# Patient Record
Sex: Female | Born: 1950 | Race: White | Hispanic: No | Marital: Married | Smoking: Never smoker
Health system: Southern US, Community
[De-identification: ages and names within clinical notes are randomized; demographics above are authoritative.]

## PROBLEM LIST (undated history)

## (undated) DIAGNOSIS — M5137 Other intervertebral disc degeneration, lumbosacral region: Secondary | ICD-10-CM

## (undated) DIAGNOSIS — M8580 Other specified disorders of bone density and structure, unspecified site: Secondary | ICD-10-CM

## (undated) DIAGNOSIS — I10 Essential (primary) hypertension: Secondary | ICD-10-CM

## (undated) DIAGNOSIS — M51379 Other intervertebral disc degeneration, lumbosacral region without mention of lumbar back pain or lower extremity pain: Secondary | ICD-10-CM

## (undated) DIAGNOSIS — F32A Depression, unspecified: Secondary | ICD-10-CM

## (undated) DIAGNOSIS — F329 Major depressive disorder, single episode, unspecified: Secondary | ICD-10-CM

## (undated) DIAGNOSIS — I00 Rheumatic fever without heart involvement: Secondary | ICD-10-CM

## (undated) DIAGNOSIS — IMO0002 Reserved for concepts with insufficient information to code with codable children: Secondary | ICD-10-CM

## (undated) DIAGNOSIS — E079 Disorder of thyroid, unspecified: Secondary | ICD-10-CM

## (undated) DIAGNOSIS — E785 Hyperlipidemia, unspecified: Secondary | ICD-10-CM

## (undated) HISTORY — PX: NO PAST SURGERIES: SHX2092

## (undated) HISTORY — DX: Rheumatic fever without heart involvement: I00

## (undated) HISTORY — DX: Other specified disorders of bone density and structure, unspecified site: M85.80

## (undated) HISTORY — DX: Hyperlipidemia, unspecified: E78.5

## (undated) HISTORY — DX: Essential (primary) hypertension: I10

## (undated) HISTORY — DX: Major depressive disorder, single episode, unspecified: F32.9

## (undated) HISTORY — DX: Reserved for concepts with insufficient information to code with codable children: IMO0002

## (undated) HISTORY — DX: Other intervertebral disc degeneration, lumbosacral region without mention of lumbar back pain or lower extremity pain: M51.379

## (undated) HISTORY — DX: Depression, unspecified: F32.A

## (undated) HISTORY — DX: Other intervertebral disc degeneration, lumbosacral region: M51.37

## (undated) HISTORY — DX: Disorder of thyroid, unspecified: E07.9

---

## 1999-04-30 ENCOUNTER — Other Ambulatory Visit: Admission: RE | Admit: 1999-04-30 | Discharge: 1999-04-30 | Payer: Self-pay | Admitting: *Deleted

## 2001-04-04 ENCOUNTER — Other Ambulatory Visit: Admission: RE | Admit: 2001-04-04 | Discharge: 2001-04-04 | Payer: Self-pay | Admitting: *Deleted

## 2002-08-23 ENCOUNTER — Other Ambulatory Visit: Admission: RE | Admit: 2002-08-23 | Discharge: 2002-08-23 | Payer: Self-pay | Admitting: *Deleted

## 2003-02-04 ENCOUNTER — Ambulatory Visit (HOSPITAL_COMMUNITY): Admission: RE | Admit: 2003-02-04 | Discharge: 2003-02-04 | Payer: Self-pay | Admitting: Gastroenterology

## 2003-10-30 ENCOUNTER — Encounter (INDEPENDENT_AMBULATORY_CARE_PROVIDER_SITE_OTHER): Payer: Self-pay | Admitting: Specialist

## 2003-10-30 ENCOUNTER — Ambulatory Visit (HOSPITAL_COMMUNITY): Admission: RE | Admit: 2003-10-30 | Discharge: 2003-10-30 | Payer: Self-pay | Admitting: Endocrinology

## 2004-09-22 ENCOUNTER — Ambulatory Visit: Payer: Self-pay | Admitting: Internal Medicine

## 2004-12-23 ENCOUNTER — Ambulatory Visit: Payer: Self-pay | Admitting: Internal Medicine

## 2005-02-08 ENCOUNTER — Ambulatory Visit: Payer: Self-pay | Admitting: Internal Medicine

## 2005-09-22 ENCOUNTER — Ambulatory Visit: Payer: Self-pay | Admitting: Internal Medicine

## 2005-09-27 ENCOUNTER — Encounter: Admission: RE | Admit: 2005-09-27 | Discharge: 2005-09-27 | Payer: Self-pay | Admitting: Internal Medicine

## 2005-12-21 ENCOUNTER — Ambulatory Visit: Payer: Self-pay | Admitting: Internal Medicine

## 2006-03-29 ENCOUNTER — Ambulatory Visit: Payer: Self-pay | Admitting: Endocrinology

## 2006-03-29 LAB — CONVERTED CEMR LAB
Cholesterol: 204 mg/dL (ref 0–200)
Direct LDL: 129.4 mg/dL
HDL: 44.5 mg/dL (ref >39.0)
TSH: 3.11 microintl units/mL (ref 0.35–5.50)
Total CHOL/HDL Ratio: 4.6
Triglycerides: 188 mg/dL — ABNORMAL HIGH (ref 0–149)
VLDL: 38 mg/dL (ref 0–40)

## 2006-04-07 ENCOUNTER — Encounter: Admission: RE | Admit: 2006-04-07 | Discharge: 2006-04-07 | Payer: Self-pay | Admitting: Endocrinology

## 2006-07-21 ENCOUNTER — Ambulatory Visit: Payer: Self-pay | Admitting: Internal Medicine

## 2006-10-10 DIAGNOSIS — F3289 Other specified depressive episodes: Secondary | ICD-10-CM | POA: Insufficient documentation

## 2006-10-10 DIAGNOSIS — F329 Major depressive disorder, single episode, unspecified: Secondary | ICD-10-CM | POA: Insufficient documentation

## 2006-10-24 ENCOUNTER — Encounter: Payer: Self-pay | Admitting: Internal Medicine

## 2006-10-27 ENCOUNTER — Encounter: Payer: Self-pay | Admitting: *Deleted

## 2006-12-05 ENCOUNTER — Ambulatory Visit: Payer: Self-pay | Admitting: Internal Medicine

## 2006-12-05 DIAGNOSIS — E039 Hypothyroidism, unspecified: Secondary | ICD-10-CM | POA: Insufficient documentation

## 2006-12-05 DIAGNOSIS — E782 Mixed hyperlipidemia: Secondary | ICD-10-CM | POA: Insufficient documentation

## 2006-12-05 DIAGNOSIS — IMO0002 Reserved for concepts with insufficient information to code with codable children: Secondary | ICD-10-CM | POA: Insufficient documentation

## 2006-12-06 ENCOUNTER — Telehealth: Payer: Self-pay | Admitting: Internal Medicine

## 2006-12-07 ENCOUNTER — Ambulatory Visit: Payer: Self-pay | Admitting: Internal Medicine

## 2006-12-07 LAB — CONVERTED CEMR LAB: Vit D, 1,25-Dihydroxy: 36 (ref 30–89)

## 2006-12-12 LAB — CONVERTED CEMR LAB
ALT: 24 units/L (ref 0–35)
AST: 26 units/L (ref 0–37)
Albumin: 4 g/dL (ref 3.5–5.2)
Alkaline Phosphatase: 88 units/L (ref 39–117)
BUN: 18 mg/dL (ref 6–23)
Basophils Absolute: 0 10*3/uL (ref 0.0–0.1)
Basophils Relative: 0.3 % (ref 0.0–1.0)
Bilirubin, Direct: 0.1 mg/dL (ref 0.0–0.3)
CO2: 27 meq/L (ref 19–32)
Calcium: 9.7 mg/dL (ref 8.4–10.5)
Chloride: 106 meq/L (ref 96–112)
Cholesterol: 197 mg/dL (ref 0–200)
Creatinine, Ser: 0.7 mg/dL (ref 0.4–1.2)
Eosinophils Absolute: 0.2 10*3/uL (ref 0.0–0.6)
Eosinophils Relative: 2.7 % (ref 0.0–5.0)
GFR calc Af Amer: 111 mL/min
GFR calc non Af Amer: 92 mL/min
Glucose, Bld: 99 mg/dL (ref 70–99)
HCT: 37.8 % (ref 36.0–46.0)
HDL: 48.9 mg/dL (ref >39.0)
Hemoglobin: 13.1 g/dL (ref 12.0–15.0)
LDL Cholesterol: 128 mg/dL — ABNORMAL HIGH (ref 0–99)
Lymphocytes Relative: 31.6 % (ref 12.0–46.0)
MCHC: 34.7 g/dL (ref 30.0–36.0)
MCV: 88.5 fL (ref 78.0–100.0)
Monocytes Absolute: 0.4 10*3/uL (ref 0.2–0.7)
Monocytes Relative: 6.9 % (ref 3.0–11.0)
Neutro Abs: 3.6 10*3/uL (ref 1.4–7.7)
Neutrophils Relative %: 58.5 % (ref 43.0–77.0)
Platelets: 310 10*3/uL (ref 150–400)
Potassium: 4.4 meq/L (ref 3.5–5.1)
RBC: 4.28 M/uL (ref 3.87–5.11)
RDW: 11.8 % (ref 11.5–14.6)
Sodium: 141 meq/L (ref 135–145)
TSH: 3.26 microintl units/mL (ref 0.35–5.50)
Total Bilirubin: 0.6 mg/dL (ref 0.3–1.2)
Total CHOL/HDL Ratio: 4
Total Protein: 6.8 g/dL (ref 6.0–8.3)
Triglycerides: 101 mg/dL (ref 0–149)
VLDL: 20 mg/dL (ref 0–40)
WBC: 6.1 10*3/uL (ref 4.5–10.5)

## 2007-04-24 ENCOUNTER — Encounter: Payer: Self-pay | Admitting: Internal Medicine

## 2007-05-03 ENCOUNTER — Telehealth: Payer: Self-pay | Admitting: Internal Medicine

## 2007-06-28 ENCOUNTER — Ambulatory Visit: Payer: Self-pay | Admitting: Internal Medicine

## 2007-06-28 LAB — CONVERTED CEMR LAB
ALT: 25 units/L (ref 0–35)
AST: 25 units/L (ref 0–37)
Albumin: 4 g/dL (ref 3.5–5.2)
Alkaline Phosphatase: 91 units/L (ref 39–117)
Bilirubin, Direct: 0.2 mg/dL (ref 0.0–0.3)
Cholesterol: 205 mg/dL (ref 0–200)
Direct LDL: 143.1 mg/dL
HDL: 51.6 mg/dL (ref >39.0)
TSH: 1.67 microintl units/mL (ref 0.35–5.50)
Total Bilirubin: 0.7 mg/dL (ref 0.3–1.2)
Total CHOL/HDL Ratio: 4
Total Protein: 6.7 g/dL (ref 6.0–8.3)
Triglycerides: 114 mg/dL (ref 0–149)
VLDL: 23 mg/dL (ref 0–40)

## 2007-07-04 ENCOUNTER — Telehealth: Payer: Self-pay | Admitting: Internal Medicine

## 2008-03-05 ENCOUNTER — Ambulatory Visit: Payer: Self-pay | Admitting: Internal Medicine

## 2008-03-05 DIAGNOSIS — E669 Obesity, unspecified: Secondary | ICD-10-CM | POA: Insufficient documentation

## 2008-03-05 DIAGNOSIS — M6580 Other synovitis and tenosynovitis, unspecified site: Secondary | ICD-10-CM | POA: Insufficient documentation

## 2008-04-11 ENCOUNTER — Encounter: Payer: Self-pay | Admitting: Internal Medicine

## 2008-08-28 ENCOUNTER — Encounter: Payer: Self-pay | Admitting: *Deleted

## 2008-08-28 ENCOUNTER — Encounter: Payer: Self-pay | Admitting: Internal Medicine

## 2008-08-28 LAB — CONVERTED CEMR LAB
Creatinine, Ser: 0.84 mg/dL
HCT: 39.4 %
Hemoglobin: 13.6 g/dL
TSH: 2.52 microintl units/mL

## 2008-10-09 ENCOUNTER — Encounter: Payer: Self-pay | Admitting: *Deleted

## 2008-11-20 ENCOUNTER — Telehealth: Payer: Self-pay | Admitting: *Deleted

## 2008-11-21 ENCOUNTER — Ambulatory Visit: Payer: Self-pay | Admitting: Internal Medicine

## 2009-02-03 ENCOUNTER — Ambulatory Visit: Payer: Self-pay | Admitting: Internal Medicine

## 2009-02-03 DIAGNOSIS — M546 Pain in thoracic spine: Secondary | ICD-10-CM | POA: Insufficient documentation

## 2009-02-03 LAB — CONVERTED CEMR LAB
Bilirubin Urine: NEGATIVE
Glucose, Urine, Semiquant: NEGATIVE
Ketones, urine, test strip: NEGATIVE
Nitrite: NEGATIVE
Protein, U semiquant: NEGATIVE
Specific Gravity, Urine: <1.005
Urobilinogen, UA: 0.2
pH: 5

## 2009-02-04 ENCOUNTER — Telehealth: Payer: Self-pay | Admitting: *Deleted

## 2009-02-07 ENCOUNTER — Telehealth: Payer: Self-pay | Admitting: *Deleted

## 2009-02-17 ENCOUNTER — Ambulatory Visit: Payer: Self-pay | Admitting: Internal Medicine

## 2009-02-17 ENCOUNTER — Telehealth: Payer: Self-pay | Admitting: Internal Medicine

## 2009-02-17 LAB — CONVERTED CEMR LAB
Bilirubin Urine: NEGATIVE
Glucose, Urine, Semiquant: NEGATIVE
Ketones, urine, test strip: NEGATIVE
Nitrite: NEGATIVE
Protein, U semiquant: NEGATIVE
Specific Gravity, Urine: 1.025
Urobilinogen, UA: 2
pH: 5

## 2009-02-18 ENCOUNTER — Telehealth: Payer: Self-pay | Admitting: Internal Medicine

## 2009-02-19 ENCOUNTER — Ambulatory Visit: Payer: Self-pay | Admitting: Internal Medicine

## 2009-02-26 ENCOUNTER — Ambulatory Visit: Payer: Self-pay | Admitting: Internal Medicine

## 2009-02-26 ENCOUNTER — Telehealth: Payer: Self-pay | Admitting: Internal Medicine

## 2009-03-07 ENCOUNTER — Telehealth: Payer: Self-pay | Admitting: *Deleted

## 2009-03-07 LAB — CONVERTED CEMR LAB
Bilirubin Urine: NEGATIVE
Ketones, ur: NEGATIVE mg/dL
Nitrite: NEGATIVE
Specific Gravity, Urine: >=1.03 (ref 1.000–1.030)
Total Protein, Urine: NEGATIVE mg/dL
Urine Glucose: NEGATIVE mg/dL
Urobilinogen, UA: 0.2 (ref 0.0–1.0)
pH: 5 (ref 5.0–8.0)

## 2009-04-14 ENCOUNTER — Encounter: Payer: Self-pay | Admitting: Internal Medicine

## 2009-06-13 ENCOUNTER — Telehealth: Payer: Self-pay | Admitting: Internal Medicine

## 2009-07-14 ENCOUNTER — Ambulatory Visit: Payer: Self-pay | Admitting: Internal Medicine

## 2009-07-17 ENCOUNTER — Ambulatory Visit: Payer: Self-pay | Admitting: Internal Medicine

## 2009-07-17 LAB — CONVERTED CEMR LAB
OCCULT 1: NEGATIVE
OCCULT 2: NEGATIVE
OCCULT 3: NEGATIVE

## 2009-07-18 ENCOUNTER — Ambulatory Visit: Payer: Self-pay | Admitting: Internal Medicine

## 2009-07-18 DIAGNOSIS — R5383 Other fatigue: Secondary | ICD-10-CM

## 2009-07-18 DIAGNOSIS — R5381 Other malaise: Secondary | ICD-10-CM | POA: Insufficient documentation

## 2009-07-20 ENCOUNTER — Emergency Department (HOSPITAL_COMMUNITY): Admission: EM | Admit: 2009-07-20 | Discharge: 2009-07-20 | Payer: Self-pay | Admitting: Emergency Medicine

## 2009-07-22 ENCOUNTER — Telehealth (INDEPENDENT_AMBULATORY_CARE_PROVIDER_SITE_OTHER): Payer: Self-pay | Admitting: *Deleted

## 2009-07-24 ENCOUNTER — Telehealth: Payer: Self-pay | Admitting: Internal Medicine

## 2009-07-24 ENCOUNTER — Ambulatory Visit: Payer: Self-pay | Admitting: Internal Medicine

## 2009-07-24 LAB — CONVERTED CEMR LAB
Bilirubin Urine: NEGATIVE
Ketones, ur: NEGATIVE mg/dL
Nitrite: NEGATIVE
Specific Gravity, Urine: >=1.03 (ref 1.000–1.030)
Total Protein, Urine: NEGATIVE mg/dL
Urine Glucose: NEGATIVE mg/dL
Urobilinogen, UA: 0.2 (ref 0.0–1.0)
pH: 5 (ref 5.0–8.0)

## 2009-08-07 ENCOUNTER — Encounter: Payer: Self-pay | Admitting: Internal Medicine

## 2009-08-08 ENCOUNTER — Encounter: Payer: Self-pay | Admitting: *Deleted

## 2009-08-08 LAB — CONVERTED CEMR LAB: TSH: 1.666 microintl units/mL

## 2009-08-26 ENCOUNTER — Encounter: Payer: Self-pay | Admitting: *Deleted

## 2009-09-18 ENCOUNTER — Telehealth: Payer: Self-pay | Admitting: Internal Medicine

## 2010-01-13 ENCOUNTER — Ambulatory Visit
Admission: RE | Admit: 2010-01-13 | Discharge: 2010-01-13 | Payer: Self-pay | Source: Home / Self Care | Attending: Internal Medicine | Admitting: Internal Medicine

## 2010-01-13 DIAGNOSIS — J029 Acute pharyngitis, unspecified: Secondary | ICD-10-CM | POA: Insufficient documentation

## 2010-01-13 LAB — CONVERTED CEMR LAB: Rapid Strep: NEGATIVE

## 2010-01-14 ENCOUNTER — Encounter: Payer: Self-pay | Admitting: Internal Medicine

## 2010-01-16 ENCOUNTER — Telehealth: Payer: Self-pay | Admitting: Internal Medicine

## 2010-01-26 ENCOUNTER — Ambulatory Visit
Admission: RE | Admit: 2010-01-26 | Discharge: 2010-01-26 | Payer: Self-pay | Source: Home / Self Care | Attending: Internal Medicine | Admitting: Internal Medicine

## 2010-02-17 NOTE — Assessment & Plan Note (Signed)
Summary: stomach pain/change in bowells/cjr   Vital Signs:  Patient profile:   60 year old female Menstrual status:  postmenopausal Weight:      202 pounds Pulse rate:   72 / minute BP sitting:   134 / 80  (left arm) Cuff size:   regular  Vitals Entered By: Romualdo Bolk, CMA (AAMA) (July 14, 2009 2:19 PM) CC: Pt had diarrhea a 2  months ago that hasn't gone away, abd pain that comes and goes. No bloating, Pt does have some gas. Pt at first thought it was stress due to taking care of mom. Pt hasn't traveled out side the country. No fever with this. No blood in stool. Pain is in the middle., CHF Management   History of Present Illness: Taylor Lee comes in today  for above problem  , acute visit . She says that her bowel habits were regular until 2 months  or so ago when had the onset of fever and diarrhea like a bowel infection that improved but then never but never resolved.  She has been busy taking care of her mom after a hip fracture so didnt attend to this until now.  She co fo loose stools and lower abd cramps  off and on . No fever weight loss or UTI signs .  Worried about colon ca . last colon was about 10 years ago.   She   has no nocturnal signs and no vomiting. and no   uti signs now.   No dietary change   or rx .   Was on ciro in January for poss UTI LAst colonoscopy.   ?   10 years ago   was normal .  ? Dr Loreta Ave   Preventive Screening-Counseling & Management  Alcohol-Tobacco     Alcohol drinks/day: <1     Alcohol type: wine     Smoking Status: never  Caffeine-Diet-Exercise     Caffeine use/day: 1-2     Does Patient Exercise: yes  Current Medications (verified): 1)  Levothroid 75 Mcg  Tabs (Levothyroxine Sodium) .Marland Kitchen.. 1 By Mouth Once Daily 2)  Vicodin 5-500 Mg Tabs (Hydrocodone-Acetaminophen) .Marland Kitchen.. 1 By Mouth Every 4-6 Hours As Needed For Pain 3)  Naproxen 500 Mg  Tabs (Naproxen) .Marland Kitchen.. 1 By Mouth Two Times A Day For Pain 4)  Cyclobenzaprine Hcl 10 Mg Tabs  (Cyclobenzaprine Hcl) .Marland Kitchen.. 1 By Mouth Three Times A Day As Needed Muslce Spasm 5)  Lorazepam 0.5 Mg Tabs (Lorazepam) .Marland Kitchen.. 1-2 By Mouth Three Times A Day As Needed Muscle Spasm of Anxiety  Allergies (verified): 1)  Darvocet-N 100  Past History:  Past medical, surgical, family and social histories (including risk factors) reviewed, and no changes noted (except as noted below).  Past Medical History: Reviewed history from 03/05/2008 and no changes required. Depression Rheumatic Fever Thyroid Disease hnp l5-S1  Consults Dr. Allegra Grana CV consult  4/09   Past Surgical History: Reviewed history from 10/10/2006 and no changes required. Denies surgical history  Past History:  Care Management: Neurosurgery: Venetia Maxon Chiro: [facility]  Family History: Reviewed history from 03/05/2008 and no changes required. Family History of Sudden Death Family History of Cardiovascular disorder  Mom  age 75  hip fracture      Social History: Reviewed history from 03/05/2008 and no changes required. Married Never Smoked Alcohol use-yes Drug use-no Regular exercise-yes dog not sick   mom had hip fracture  was caretaker but doing well.     Review of  Systems       back   pain better after exercise and chiro not on meds for this now,  alos had gyne eval for bloating and neg eval for gyne pathology and had neg labs last august.   Physical Exam  General:  Well-developed,well-nourished,in no acute distress; alert,appropriate and cooperative throughout examination Head:  normocephalic and atraumatic.   Eyes:  vision grossly intact, pupils equal, pupils round, and pupils reactive to light.   Neck:  No deformities, masses, or tenderness noted. Lungs:  Normal respiratory effort, chest expands symmetrically. Lungs are clear to auscultation, no crackles or wheezes. Heart:  Normal rate and regular rhythm. S1 and S2 normal without gallop, murmur, click, rub or other extra sounds. Abdomen:   soft, normal bowel sounds, no distention, no hepatomegaly, and no splenomegaly.  mildly tender suprapubic area  and pelvic area  Pulses:  pulses intact without delay   Extremities:  no clubbing cyanosis or edema  Neurologic:  non focal Skin:  turgor normal.   Cervical Nodes:  No lymphadenopathy noted Psych:  Oriented X3, normally interactive, good eye contact, not depressed appearing, and slightly anxious.     Impression & Recommendations:  Problem # 1:  DIARRHEA (ICD-787.91) Assessment New onset after what sounds like an acute GE .    will rx empirically for  bowel overgrowth and c diff  pending tests,  Orders: T-Stool Giardia / Crypto- EIA ([address]) T-Culture, C-Diff Toxin A/B (60454-09811) T-Culture, Stool ([id number]) T-Fecal Lactoferrin (91478) Gastroenterology Referral (GI)  Problem # 2:  ABDOMINAL CRAMPS (ICD-789.00) Assessment: New  see above  Orders: Gastroenterology Referral (GI)  Problem # 3:  HYPOTHYROIDISM (ICD-244.9)  Her updated medication list for this problem includes:    Levothroid 75 Mcg Tabs (Levothyroxine sodium) .Marland Kitchen... 1 by mouth once daily  Labs Reviewed: TSH: 2.52 (08/28/2008)    Chol: 205 (06/28/2007)   HDL: 51.6 (06/28/2007)   LDL: DEL (06/28/2007)   TG: 114 (06/28/2007)  Problem # 4:  HYPERLIPIDEMIA (ICD-272.2)  Complete Medication List: 1)  Levothroid 75 Mcg Tabs (Levothyroxine sodium) .Marland Kitchen.. 1 by mouth once daily 2)  Vicodin 5-500 Mg Tabs (Hydrocodone-acetaminophen) .Marland Kitchen.. 1 by mouth every 4-6 hours as needed for pain 3)  Naproxen 500 Mg Tabs (Naproxen) .Marland Kitchen.. 1 by mouth two times a day for pain 4)  Cyclobenzaprine Hcl 10 Mg Tabs (Cyclobenzaprine hcl) .Marland Kitchen.. 1 by mouth three times a day as needed muslce spasm 5)  Lorazepam 0.5 Mg Tabs (Lorazepam) .Marland Kitchen.. 1-2 by mouth three times a day as needed muscle spasm of anxiety 6)  Metronidazole 500 Mg Tabs (Metronidazole) .Marland Kitchen.. 1 by mouth three times a day  CHF Assessment/Plan:      The patient's current  weight is 202 pounds.  Her previous weight was 199 pounds.    Patient Instructions: 1)  collect the stool test to the lab. 2)  then can begin the metronidazole medication. 3)  will do a  GI referral     for the above     and plan for colonosocopy . 4)  Will plan a  checkup   in the fall  Prescriptions: METRONIDAZOLE 500 MG TABS (METRONIDAZOLE) 1 by mouth three times a day  #21 x 0   Entered and Authorized by:   Madelin Headings MD   Signed by:   Madelin Headings MD on 07/14/2009   Method used:   Electronically to        [facility] #[id number]* (retail)  [address]       [place], [place]  [address]       Ph: [phone]       Fax: [phone]   RxID:   [id number]  cpx in the fall  sept  with labs  or as needed

## 2010-02-17 NOTE — Progress Notes (Signed)
  Phone Note From Pharmacy   Summary of Call: Vicodin tablets 5 - 500mg  from [facility] Initial call taken by: Kathrynn Speed CMA,  Jun 13, 2009 1:24 PM  Follow-up for Phone Call        ok x 1  Follow-up by: Madelin Headings MD,  Jun 13, 2009 1:28 PM    Prescriptions: VICODIN 5-500 MG TABS (HYDROCODONE-ACETAMINOPHEN) 1 by mouth every 4-6 hours as needed for pain  #40 x 0   Entered by:   Lynann Beaver CMA   Authorized by:   Madelin Headings MD   Signed by:   Lynann Beaver CMA on 06/13/2009   Method used:   Telephoned to ...       [facility] #[id number]* (retail)       [address]       [place], [place]  [address]       Ph: [phone]       Fax: [phone]   RxID:   [id number]

## 2010-02-17 NOTE — Progress Notes (Signed)
Summary: INFO ONLY  Phone Note Call from Patient   Summary of Call: Pt called to adv that she had to go to the ED this past weekend and was evaluated.... Pt adv they dx her with a UTI and prescribed abx that have started helping her already.... Pt adv she had a CT and they told her that everything checked out ok and that the only thing she had wrong was a UTI.Marland KitchenMarland KitchenMarland KitchenPt adv that she feels that she doesn't need any further testing because of recent sxs...Marland KitchenMarland KitchenMarland Kitchen Pt can be reached at [phone] with any questions or concerns.   Initial call taken by: Debbra Riding,  July 22, 2009 10:00 AM  Follow-up for Phone Call        she still needs to be evaluated by GI  all of her signs do not fit  uti as the only diagnosis . also  the urine culture  from the ED  showec no germ growing  like the one we got in January ( itsposs from  beginning antibioit c.?)  We  see her in follow up next week to repeat her U/A   to make sure it looks clear  Follow-up by: Madelin Headings MD,  July 22, 2009 1:16 PM  Additional Follow-up for Phone Call Additional follow up Details #1::        Informed pt Additional Follow-up by: Josph Macho RMA,  July 23, 2009 9:40 AM

## 2010-02-17 NOTE — Progress Notes (Signed)
Summary: form  Phone Note Call from Patient Call back at [facility] Phone [phone]   Call For: panosh Summary of Call: Had to cancel a trip because of my back.  I am bringing a form by from travel agency for Dr. Demetrius Charity to fill out.  Please call me if any questions or to pick form up.   Initial call taken by: Rudy Jew, RN,  February 18, 2009 12:00 PM  Follow-up for Phone Call        form was completed . Follow-up by: Madelin Headings MD,  February 23, 2009 5:45 PM

## 2010-02-17 NOTE — Miscellaneous (Signed)
Summary: labs done by Dr. Loreta Ave   Clinical Lists Changes  Observations: Added new observation of TSH: 1.666 microintl units/mL (08/08/2009 9:54)      -  Date:  08/08/2009    TSH 1.666

## 2010-02-17 NOTE — Progress Notes (Signed)
Summary: refill on antibiotic  Phone Note Call from Patient Call back at Home Phone [phone]   Caller: Patient Summary of Call: Pt is going be out of the country by the time she finishes the anitbiotic. So she will come back in once she gets back 2 weeks later. She is wanting to know if we could refill her medication so she would have it to take with her incase she needs it while she is over seas? Initial call taken by: Romualdo Bolk, CMA (AAMA),  February 07, 2009 10:53 AM  Follow-up for Phone Call        we can refill it  .   also  .  rec get repeat UA   with microscopic at [facility]  to ensure that things are better  Follow-up by: Madelin Headings MD,  February 07, 2009 12:23 PM  Additional Follow-up for Phone Call Additional follow up Details #1::        Left message on machine that rx has been called in and that she needs to call us back once she get back in town to get a repeat ua. Additional Follow-up by: Romualdo Bolk, CMA (AAMA),  February 07, 2009 1:05 PM    Prescriptions: CIPROFLOXACIN HCL 500 MG TABS (CIPROFLOXACIN HCL) 1 by mouth two times a day  #14 x 0   Entered by:   Romualdo Bolk, CMA (AAMA)   Authorized by:   Madelin Headings MD   Signed by:   Romualdo Bolk, CMA (AAMA) on 02/07/2009   Method used:   Electronically to        [facility] #[id number]* (retail)       [address]       [place], [place]  [address]       Ph: [phone]       Fax: [phone]   RxID:   [id number]

## 2010-02-17 NOTE — Progress Notes (Signed)
Summary: needs a note for trip  Phone Note Call from Patient Call back at Home Phone [phone]   Caller: Patient Summary of Call: Pt called waiting a note because she is unable to make her trip. She can't sit. Initial call taken by: Romualdo Bolk, CMA (AAMA),  February 17, 2009 4:44 PM

## 2010-02-17 NOTE — Letter (Signed)
Summary: [facility]  [facility]   Imported By: Maryln Gottron 08/14/2009 13:27:52  _____________________________________________________________________  External Attachment:    Type:   Image     Comment:   External Document

## 2010-02-17 NOTE — Progress Notes (Signed)
Summary: beets  Phone Note Call from Patient Call back at [facility] Phone [phone] Call back at [phone] c   Summary of Call: Ate beets last pm & they'll probably turn my urine pink.  Do I need to reschedule to the urine at [facility]? Initial call taken by: Rudy Jew, RN,  February 26, 2009 10:17 AM  Follow-up for Phone Call        Left message for pt that she could reschedule but since she already went not to worry about it and we'll see what the ua shows. Follow-up by: Romualdo Bolk, CMA (AAMA),  February 26, 2009 1:13 PM

## 2010-02-17 NOTE — Progress Notes (Signed)
Summary: abdominal pain continued.  Phone Note Call from Patient   Caller: Patient Call For: Madelin Headings MD Summary of Call: Pt wants to see Dr Fabian Sharp today as her abdominal pain has become worse.  Has diarrrhea, no fever, no vomiting, pain is lower abdomen with NO UTI symptoms.  Still taking Cipro, and Flagyl.  Taking pain pills occ.  [phone] Initial call taken by: Lynann Beaver CMA,  July 24, 2009 8:19 AM  Follow-up for Phone Call        Per Dr. Fabian Sharp .........change Cipro to Macrobid 100 mg. two times a day x 7 days UA with micro at [facility] up GI appt.  Pt notified.    New/Updated Medications: MACROBID 100 MG CAPS (NITROFURANTOIN MONOHYD MACRO) one by mouth bid Prescriptions: MACROBID 100 MG CAPS (NITROFURANTOIN MONOHYD MACRO) one by mouth bid  #14 x 0   Entered by:   Lynann Beaver CMA   Authorized by:   Madelin Headings MD   Signed by:   Lynann Beaver CMA on 07/24/2009   Method used:   Electronically to        [facility] #[id number]* (retail)       [address]       [place], [place]  [address]       Ph: [phone]       Fax: [phone]   RxID:   [id number]

## 2010-02-17 NOTE — Letter (Signed)
Summary: Attending Physician's Statement  Attending Physician's Statement   Imported By: Maryln Gottron 02/28/2009 10:49:38  _____________________________________________________________________  External Attachment:    Type:   Image     Comment:   External Document

## 2010-02-17 NOTE — Progress Notes (Signed)
Summary: results of xrays  Phone Note Call from Patient   Caller: Patient Call For: Madelin Headings MD Summary of Call: Pt would like chest xray and thoracic spine results, please. [phone] Initial call taken by: Lynann Beaver CMA,  February 04, 2009 3:06 PM  Follow-up for Phone Call        chest x ray show no acute disease or pneumonia.   spine x ray shows some mild degenerative changes  .  POss to cause pain but changes are mild.  on the x ray. Follow-up by: Madelin Headings MD,  February 04, 2009 5:28 PM  Additional Follow-up for Phone Call Additional follow up Details #1::        Pt aware of results. Additional Follow-up by: Romualdo Bolk, CMA Duncan Dull),  February 04, 2009 5:36 PM

## 2010-02-17 NOTE — Assessment & Plan Note (Signed)
Summary: fup bladder infection//ccm   Vital Signs:  Patient profile:   60 year old female Menstrual status:  postmenopausal Weight:      199 pounds Pulse rate:   66 / minute BP sitting:   130 / 80  (right arm) Cuff size:   regular  Vitals Entered By: Romualdo Bolk, CMA (AAMA) (February 17, 2009 8:46 AM) CC: Follow-up visit on bladder infection   History of Present Illness: Taylor Lee comesin     today for    follow up of fever, rti ,abnormal UA ,and back pain  The thorax pain is better  but still has the Lumbar pain  with shooting pains  down buttocks ( her HNP ) pain  although Back better  but fearful that it will reexacerbated  .   Hurts with sitting.    Walking ok now. 50 % better.   worried it will get worse on plight where she will need to sit for 12 hours. there.  No bowel or bladder dysfunction. Vicodin as needed.  Last dose  days ago.   flexeril as needed.  No dysuria  or other uti signs at present.   Preventive Screening-Counseling & Management  Alcohol-Tobacco     Alcohol drinks/day: <1     Alcohol type: wine     Smoking Status: never  Caffeine-Diet-Exercise     Caffeine use/day: 1-2     Does Patient Exercise: yes  Current Medications (verified): 1)  Levothroid 75 Mcg  Tabs (Levothyroxine Sodium) .Marland Kitchen.. 1 By Mouth Once Daily 2)  Vicodin 5-500 Mg Tabs (Hydrocodone-Acetaminophen) .Marland Kitchen.. 1 By Mouth Every 4-6 Hours As Needed For Pain 3)  Naproxen 500 Mg  Tabs (Naproxen) .Marland Kitchen.. 1 By Mouth Two Times A Day For Pain 4)  Cyclobenzaprine Hcl 10 Mg Tabs (Cyclobenzaprine Hcl) .Marland Kitchen.. 1 By Mouth Three Times A Day As Needed Muslce Spasm  Allergies (verified): 1)  Darvocet-N 100  Past History:  Past medical, surgical, family and social histories (including risk factors) reviewed, and no changes noted (except as noted below).  Past Medical History: Reviewed history from 03/05/2008 and no changes required. Depression Rheumatic Fever Thyroid Disease hnp  l5-S1  Consults Dr. Allegra Grana CV consult  4/09   Past Surgical History: Reviewed history from 10/10/2006 and no changes required. Denies surgical history  Family History: Reviewed history from 03/05/2008 and no changes required. Family History of Sudden Death Family History of Cardiovascular disorder      Social History: Reviewed history from 03/05/2008 and no changes required. Married Never Smoked Alcohol use-yes Drug use-no Regular exercise-yes    Review of Systems  The patient denies anorexia, fever, weight loss, weight gain, vision loss, hoarseness, prolonged cough, hemoptysis, abdominal pain, melena, hematochezia, severe indigestion/heartburn, hematuria, suspicious skin lesions, transient blindness, unusual weight change, abnormal bleeding, enlarged lymph nodes, and angioedema.         no numbness or weakness  Physical Exam  General:  Well-developed,well-nourished,in no acute distress; alert,appropriate and cooperative throughout examination Head:  normocephalic and atraumatic.   Neck:  No deformities, masses, or tenderness noted. Abdomen:  no cva pain Msk:  tender  ls area   but gait ok today  Pulses:  pulses intact without delay   Extremities:  no clubbing cyanosis or edema  Neurologic:  alert & oriented X3 and gait normal.   Skin:  turgor normal and color normal.   Psych:  Oriented X3, normally interactive, good eye contact, not depressed appearing, and slightly anxious.  Impression & Recommendations:  Problem # 1:  BACK PAIN WITH RADICULOPATHY (ICD-729.2) improved but unusre if ok to travel for 12 hours sitting on an international flight  .   disc options with pt.   Problem # 2:  UPPER RESPIRATORY INFECTION, VIRAL (ICD-465.9) Assessment: Improved  Her updated medication list for this problem includes:    Naproxen 500 Mg Tabs (Naproxen) .Marland Kitchen... 1 by mouth two times a day for pain  Problem # 3:  URINALYSIS, ABNORMAL (ICD-791.9)  ? if   significant  take cipr o to take with her   Orders: UA Dipstick w/o Micro (automated)  (81003) T-Culture, Urine ([id number])  Problem # 4:  BACK PAIN, THORACIC REGION (ICD-724.1) Assessment: Improved  Her updated medication list for this problem includes:    Vicodin 5-500 Mg Tabs (Hydrocodone-acetaminophen) .Marland Kitchen... 1 by mouth every 4-6 hours as needed for pain    Naproxen 500 Mg Tabs (Naproxen) .Marland Kitchen... 1 by mouth two times a day for pain    Cyclobenzaprine Hcl 10 Mg Tabs (Cyclobenzaprine hcl) .Marland Kitchen... 1 by mouth three times a day as needed muslce spasm  Complete Medication List: 1)  Levothroid 75 Mcg Tabs (Levothyroxine sodium) .Marland Kitchen.. 1 by mouth once daily 2)  Vicodin 5-500 Mg Tabs (Hydrocodone-acetaminophen) .Marland Kitchen.. 1 by mouth every 4-6 hours as needed for pain 3)  Naproxen 500 Mg Tabs (Naproxen) .Marland Kitchen.. 1 by mouth two times a day for pain 4)  Cyclobenzaprine Hcl 10 Mg Tabs (Cyclobenzaprine hcl) .Marland Kitchen.. 1 by mouth three times a day as needed muslce spasm 5)  Lorazepam 0.5 Mg Tabs (Lorazepam) .Marland Kitchen.. 1-2 by mouth three times a day as needed muscle spasm of anxiety  Patient Instructions: 1)  can take lorezepam  before flight  for anxiety and muscle spasm( (not at the same time as flexeril)  2)  Frequent  standing and walking if able .  3)  Call  if pain too much  to travel the 12 hours . Prescriptions: LORAZEPAM 0.5 MG TABS (LORAZEPAM) 1-2 by mouth three times a day as needed muscle spasm of anxiety  #20 x 0   Entered and Authorized by:   Madelin Headings MD   Signed by:   Madelin Headings MD on 02/17/2009   Method used:   Print then Give to Patient   RxID:   [id number]   Laboratory Results   Urine Tests  Date/Time Received: February 17, 2009 8:58 AM   Routine Urinalysis   Glucose: negative   (Normal Range: Negative) Bilirubin: negative   (Normal Range: Negative) Ketone: negative   (Normal Range: Negative) Spec. Gravity: 1.025   (Normal Range: 1.003-1.035) Blood: small   (Normal Range:  Negative) pH: 5.0   (Normal Range: 5.0-8.0) Protein: negative   (Normal Range: Negative) Urobilinogen: 2.0   (Normal Range: 0-1) Nitrite: negative   (Normal Range: Negative) Leukocyte Esterace: small   (Normal Range: Negative)    Comments: Romualdo Bolk, CMA (AAMA)  February 17, 2009 9:02 AM

## 2010-02-17 NOTE — Assessment & Plan Note (Signed)
Summary: abdomen pain better but continues/dm   Vital Signs:  Patient profile:   60 year old female Menstrual status:  postmenopausal Weight:      205 pounds Temp:     98.5 degrees F oral Pulse rate:   88 / minute BP sitting:   150 / 90  (left arm) Cuff size:   regular  Vitals Entered By: Romualdo Bolk, CMA (AAMA) (July 18, 2009 3:54 PM) CC: Pt is still having abd. pain and now has weakness with it. Pt found a tick embedded on her behind rt ear on June 10th.   History of Present Illness: Taylor Lee comes  in today  for sda because  her abd signs  got worse today and felt weak and wanted to be seen before the holiday weekend.  Is on 2-3 of flagyl and felt better yesterday    and had less cramps.   Today now worse again and  and feels weak.   No fever.  no rashes  . tick bite month ago .  Now is very worried  she could have cancer or something else bad. I consult is pending.  HAsnt taken anything for pain .? can take hydrocodone?  Preventive Screening-Counseling & Management  Alcohol-Tobacco     Alcohol drinks/day: <1     Alcohol type: wine     Smoking Status: never  Caffeine-Diet-Exercise     Caffeine use/day: 1-2     Does Patient Exercise: yes  Current Medications (verified): 1)  Levothroid 75 Mcg  Tabs (Levothyroxine Sodium) .Marland Kitchen.. 1 By Mouth Once Daily 2)  Vicodin 5-500 Mg Tabs (Hydrocodone-Acetaminophen) .Marland Kitchen.. 1 By Mouth Every 4-6 Hours As Needed For Pain 3)  Naproxen 500 Mg  Tabs (Naproxen) .Marland Kitchen.. 1 By Mouth Two Times A Day For Pain 4)  Cyclobenzaprine Hcl 10 Mg Tabs (Cyclobenzaprine Hcl) .Marland Kitchen.. 1 By Mouth Three Times A Day As Needed Muslce Spasm 5)  Lorazepam 0.5 Mg Tabs (Lorazepam) .Marland Kitchen.. 1-2 By Mouth Three Times A Day As Needed Muscle Spasm of Anxiety 6)  Metronidazole 500 Mg Tabs (Metronidazole) .Marland Kitchen.. 1 By Mouth Three Times A Day  Allergies (verified): 1)  Darvocet-N 100  Past History:  Past medical, surgical, family and social histories (including risk  factors) reviewed, and no changes noted (except as noted below).  Past Medical History: Reviewed history from 03/05/2008 and no changes required. Depression Rheumatic Fever Thyroid Disease hnp l5-S1  Consults Dr. Allegra Grana CV consult  4/09   Past Surgical History: Reviewed history from 10/10/2006 and no changes required. Denies surgical history  Past History:  Care Management: Neurosurgery: Venetia Maxon Chiro: Fonke  Family History: Reviewed history from 07/14/2009 and no changes required. Family History of Sudden Death Family History of Cardiovascular disorder  Mom  age 31  hip fracture      Social History: Reviewed history from 07/14/2009 and no changes required. Married Never Smoked Alcohol use-yes Drug use-no Regular exercise-yes dog not sick   mom had hip fracture  was caretaker but doing well.     Review of Systems       The patient complains of anorexia.  The patient denies fever, weight loss, weight gain, vision loss, decreased hearing, melena, hematochezia, severe indigestion/heartburn, hematuria, genital sores, difficulty walking, unusual weight change, abnormal bleeding, enlarged lymph nodes, and angioedema.    Physical Exam  General:  alert, well-developed, and well-nourished.  worried tearful but in nad  Head:  normocephalic and atraumatic.   Eyes:  vision grossly  intact and pupils equal.   Neck:  No deformities, masses, or tenderness noted. Lungs:  Normal respiratory effort, chest expands symmetrically. Lungs are clear to auscultation, no crackles or wheezes. Heart:  Normal rate and regular rhythm. S1 and S2 normal without gallop, murmur, click, rub or other extra sounds. Abdomen:  soft, normal bowel sounds, no distention, no hepatomegaly, and no splenomegaly.  mildly tender suprapubic area  and pelvic area  / left no mass and no guard or rebound and gait is nl  neg psoas    sign  Pulses:  nl cap refill  Extremities:  no clubbing cyanosis or  edema  Neurologic:  non focal  Skin:  turgor normal, color normal, no ecchymoses, and no petechiae.   Cervical Nodes:  No lymphadenopathy noted Psych:  Oriented X3 and slightly anxious.     Impression & Recommendations:  Problem # 1:  ABDOMINAL CRAMPS (ICD-789.00) had been better for once day and now worse  c diff neg and  neg for blood and wbc.    had omrpovement and then worse again   will treat for diverticulitis   to the ED  if getting worse  in the meantime or call the oncal service   Problem # 2:  MALAISE AND FATIGUE (ICD-780.79) x 1 day  dont really think its the med       patient is very anxious   about this     Problem # 3:  DIARRHEA (ICD-787.91) Assessment: Improved after 2 days of med   ?   Complete Medication List: 1)  Levothroid 75 Mcg Tabs (Levothyroxine sodium) .Marland Kitchen.. 1 by mouth once daily 2)  Vicodin 5-500 Mg Tabs (Hydrocodone-acetaminophen) .Marland Kitchen.. 1 by mouth every 4-6 hours as needed for pain 3)  Naproxen 500 Mg Tabs (Naproxen) .Marland Kitchen.. 1 by mouth two times a day for pain 4)  Cyclobenzaprine Hcl 10 Mg Tabs (Cyclobenzaprine hcl) .Marland Kitchen.. 1 by mouth three times a day as needed muslce spasm 5)  Lorazepam 0.5 Mg Tabs (Lorazepam) .Marland Kitchen.. 1-2 by mouth three times a day as needed muscle spasm of anxiety 6)  Metronidazole 500 Mg Tabs (Metronidazole) .Marland Kitchen.. 1 by mouth three times a day 7)  Ciprofloxacin Hcl 500 Mg Tabs (Ciprofloxacin hcl) .Marland Kitchen.. 1 by mouth two times a day  Patient Instructions: 1)  we are treating you for possible diverticulitis. 2)  with 2 antibiotics 3)  stay on these.  over the  weekend   and call on tuesday  for my nurse or triage. about how you are doing .  So we can plan follow up .  Prescriptions: CIPROFLOXACIN HCL 500 MG TABS (CIPROFLOXACIN HCL) 1 by mouth two times a day  #20 x 0   Entered and Authorized by:   Madelin Headings MD   Signed by:   Madelin Headings MD on 07/18/2009   Method used:   Electronically to        [facility] #[id number]* (retail)        [address]       [place], [place]  [address]       Ph: [phone]       Fax: [phone]   RxID:   [id number]

## 2010-02-17 NOTE — Progress Notes (Signed)
Summary: Pt req to get shingles vaccine if available  Phone Note Call from Patient Call back at Home Phone [phone]   Caller: Patient Summary of Call: Pt is interested in getting shingles vaccine and is wondering if it is available. Pls advise.  Initial call taken by: Lucy Antigua,  September 18, 2009 11:56 AM  Follow-up for Phone Call        Okay to schedule appt. Tell pt to make sure her ins covers vaccine. Follow-up by: Romualdo Bolk, CMA (AAMA),  September 19, 2009 8:09 AM  Additional Follow-up for Phone Call Additional follow up Details #1::        I lft vm for pt to cb.  Additional Follow-up by: Lucy Antigua,  September 19, 2009 9:11 AM    Additional Follow-up for Phone Call Additional follow up Details #2::    I called and lft another vm for pt to cb. Waiting on call back. Follow-up by: Lucy Antigua,  September 23, 2009 11:42 AM  Additional Follow-up for Phone Call Additional follow up Details #3:: Details for Additional Follow-up Action Taken: I called and notified pt that it is ok to sch appt for shingles vaccine, but pt needs to be sure to contact her insurance company to  make sure that they will cover the vaccine, as noted above.  Additional Follow-up by: Lucy Antigua,  September 25, 2009 10:11 AM

## 2010-02-17 NOTE — Progress Notes (Signed)
Summary: still having lower back pain  Phone Note Call from Patient Call back at Home Phone [phone]   Caller: Patient Summary of Call: Pt states that she is still having lower back pain and it hurts to sit. What is the next step? Initial call taken by: Romualdo Bolk, CMA Duncan Dull),  March 07, 2009 1:39 PM  Follow-up for Phone Call        rec see ortho pedics consult Follow-up by: Madelin Headings MD,  March 14, 2009 5:11 PM  Additional Follow-up for Phone Call Additional follow up Details #1::        Pt aware of this and will call us back about this. She states that the back pain has gotten better.  Additional Follow-up by: Romualdo Bolk, CMA Duncan Dull),  March 14, 2009 5:16 PM

## 2010-02-17 NOTE — Assessment & Plan Note (Signed)
Summary: fever/chills/back pain/njr   Vital Signs:  Patient profile:   61 year old female Menstrual status:  postmenopausal Height:      65 inches Weight:      200 pounds BMI:     33.40 Temp:     98.0 degrees F oral Pulse rate:   72 / minute BP sitting:   150 / 80  (right arm) Cuff size:   regular  Vitals Entered By: Romualdo Bolk, CMA (AAMA) (February 03, 2009 10:03 AM) CC: Fever over 100 (in the middle of the night), chills, middle back pain that has been going on before christmas. Pt is leaving 2/1 for a trip to the [facility].  LMP - Character: age [age] Menarche (age onset years): 33    Menstrual Status postmenopausal   History of Present Illness: Taylor Lee comes in today for   for Acute visit for above.  She has a number of issues.   Fever since 24-48 hours ago  with chills  ..then developed  sore glands and  cough  .   trying   otcs no face pain .   Back since  December  and has taken 10 vidodin for pain.  Seeing chiropractor therapist  thinking that her problem is stress. SHe has had LBP but this is in thorax area.  Denies dysuria  some frequency . NO vag problem.   Supposed to travel soon and worried that her back will keep her from travel as it feels badlu. She has seen NS for lumbar HNP  no imaging of mid back. Denies injury.   Preventive Screening-Counseling & Management  Alcohol-Tobacco     Alcohol drinks/day: <1     Alcohol type: wine     Smoking Status: never  Caffeine-Diet-Exercise     Caffeine use/day: 1-2     Does Patient Exercise: yes  Current Medications (verified): 1)  Levothroid 75 Mcg  Tabs (Levothyroxine Sodium) .Marland Kitchen.. 1 By Mouth Once Daily 2)  Vicodin 5-500 Mg Tabs (Hydrocodone-Acetaminophen) .Marland Kitchen.. 1 By Mouth Every 4-6 Hours As Needed For Pain 3)  Naproxen 500 Mg  Tabs (Naproxen) .Marland Kitchen.. 1 By Mouth Two Times A Day For Pain  Allergies (verified): 1)  Darvocet-N 100 (Propoxyphene N-Apap)  Past History:  Past medical, surgical, family and  social histories (including risk factors) reviewed, and no changes noted (except as noted below).  Past Medical History: Reviewed history from 03/05/2008 and no changes required. Depression Rheumatic Fever Thyroid Disease hnp l5-S1  Consults Dr. Allegra Grana CV consult  4/09   Past Surgical History: Reviewed history from 10/10/2006 and no changes required. Denies surgical history  Past History:  Care Management: Neurosurgery: Venetia Maxon Chiro: Fonke  Family History: Reviewed history from 03/05/2008 and no changes required. Family History of Sudden Death Family History of Cardiovascular disorder      Social History: Reviewed history from 03/05/2008 and no changes required. Married Never Smoked Alcohol use-yes Drug use-no Regular exercise-yes  Caffeine use/day:  1-2  Review of Systems       The patient complains of anorexia and fever.  The patient denies weight loss, weight gain, vision loss, decreased hearing, hoarseness, chest pain, syncope, dyspnea on exertion, peripheral edema, prolonged cough, hemoptysis, abdominal pain, melena, hematochezia, severe indigestion/heartburn, muscle weakness, difficulty walking, unusual weight change, abnormal bleeding, enlarged lymph nodes, and angioedema.         non numbness nor rash .  no weakness  Physical Exam  General:  congested in mild discomfort  with rhinorrhea Head:  normocephalic and atraumatic.   Eyes:  vision grossly intact, pupils equal, and pupils round.   Ears:  R ear normal, L ear normal, and no external deformities.   Nose:  no external deformity and no external erythema.  congested  Mouth:  pharynx pink and moist.   nno edema  Neck:  shoddy nodes  ac   no masses  Lungs:  Normal respiratory effort, chest expands symmetrically. Lungs are clear to auscultation, no crackles or wheezes.no dullness.   Heart:  Normal rate and regular rhythm. S1 and S2 normal without gallop, murmur, click, rub or other extra  sounds.no lifts.   Abdomen:  Bowel sounds positive,abdomen soft and non-tender without masses, organomegaly or   noted. Msk:  tender mid thorac spine area  no rash   no lesion Pulses:  nl cap refill  Extremities:  no clubbing cyanosis or edema  Neurologic:  alert & oriented X3 and gait normal.   Skin:  turgor normal, color normal, no ecchymoses, and no petechiae.   Cervical Nodes:  No lymphadenopathy noted Psych:  Oriented X3, good eye contact, not anxious appearing, and not depressed appearing.     Impression & Recommendations:  Problem # 1:  FEVER UNSPECIFIED (ICD-780.60) seems like a flu like illness RTI cause  although with back pain and abn UA  consider renal infection Orders: T-2 View CXR (71020TC)  Problem # 2:  URINALYSIS, ABNORMAL (ICD-791.9)  Problem # 3:  BACK PAIN, THORACIC REGION (ICD-724.1) Assessment: New under chiro care and was felt to be stress but not getting better.  Her updated medication list for this problem includes:    Vicodin 5-500 Mg Tabs (Hydrocodone-acetaminophen) .Marland Kitchen... 1 by mouth every 4-6 hours as needed for pain    Naproxen 500 Mg Tabs (Naproxen) .Marland Kitchen... 1 by mouth two times a day for pain    Cyclobenzaprine Hcl 10 Mg Tabs (Cyclobenzaprine hcl) .Marland Kitchen... 1 by mouth three times a day as needed muslce spasm  Orders: T-Thoracic Spine 2 Views (72070TC) T-2 View CXR (71020TC)  Problem # 4:  UPPER RESPIRATORY INFECTION, VIRAL (ICD-465.9) Assessment: Comment Only  Her updated medication list for this problem includes:    Naproxen 500 Mg Tabs (Naproxen) .Marland Kitchen... 1 by mouth two times a day for pain  Complete Medication List: 1)  Levothroid 75 Mcg Tabs (Levothyroxine sodium) .Marland Kitchen.. 1 by mouth once daily 2)  Vicodin 5-500 Mg Tabs (Hydrocodone-acetaminophen) .Marland Kitchen.. 1 by mouth every 4-6 hours as needed for pain 3)  Naproxen 500 Mg Tabs (Naproxen) .Marland Kitchen.. 1 by mouth two times a day for pain 4)  Ciprofloxacin Hcl 500 Mg Tabs (Ciprofloxacin hcl) .Marland Kitchen.. 1 by mouth two times a  day 5)  Cyclobenzaprine Hcl 10 Mg Tabs (Cyclobenzaprine hcl) .Marland Kitchen.. 1 by mouth three times a day as needed muslce spasm  Other Orders: UA Dipstick W/ Micro (manual) (16109) T-Culture, Urine ([id number])  Patient Instructions: 1)  take the antiinflammatory for at least 2 weeks to decide it doesnt help. Can add on  narcotic pain pill if needed.  2)  AAdd muscle muscle relaxant at night. 3)  Get chest and spine  x ray and will let you  know result. 4)  Antibiotic pending urine culture. 5)  I think the respiratory infection is viral.  6)  can take decongestants in day if BP is ok. 7)  Afrin at night for 3 nights may help sleep. 8)  fever should be gone in 72 hours call if not.  Prescriptions:  CYCLOBENZAPRINE HCL 10 MG TABS (CYCLOBENZAPRINE HCL) 1 by mouth three times a day as needed muslce spasm  #30 x 0   Entered and Authorized by:   Madelin Headings MD   Signed by:   Madelin Headings MD on 02/03/2009   Method used:   Electronically to        [facility] [phone]* (retail)       [address]       [place], [place]  [address]       Ph: [phone]       Fax: [phone]   RxID:   [id number] VICODIN 5-500 MG TABS (HYDROCODONE-ACETAMINOPHEN) 1 by mouth every 4-6 hours as needed for pain  #40 x 0   Entered and Authorized by:   Madelin Headings MD   Signed by:   Madelin Headings MD on 02/03/2009   Method used:   Print then Give to Patient   RxID:   [id number] CIPROFLOXACIN HCL 500 MG TABS (CIPROFLOXACIN HCL) 1 by mouth two times a day  #14 x 0   Entered and Authorized by:   Madelin Headings MD   Signed by:   Madelin Headings MD on 02/03/2009   Method used:   Electronically to        [facility] [phone]* (retail)       [address]       [place], [place]  [address]       Ph: [phone]       Fax: [phone]   RxID:   [id number]   Laboratory Results   Urine Tests  Date/Time Received:  February 03, 2009 10:25 AM   Routine Urinalysis   Glucose: negative   (Normal Range: Negative) Bilirubin: negative   (Normal Range: Negative) Ketone: negative   (Normal Range: Negative) Spec. Gravity: <1.005   (Normal Range: 1.003-1.035) Blood: small   (Normal Range: Negative) pH: 5.0   (Normal Range: 5.0-8.0) Protein: negative   (Normal Range: Negative) Urobilinogen: 0.2   (Normal Range: 0-1) Nitrite: negative   (Normal Range: Negative) Leukocyte Esterace: large   (Normal Range: Negative)    Comments: Romualdo Bolk, CMA (AAMA)  February 03, 2009 10:26 AM

## 2010-02-19 NOTE — Assessment & Plan Note (Signed)
Summary: ST/NJR   Vital Signs:  Patient profile:   60 year old female Menstrual status:  postmenopausal Height:      65 inches Weight:      202 pounds BMI:     33.74 Temp:     98.4 degrees F oral Pulse rate:   78 / minute BP sitting:   110 / 70  (left arm) Cuff size:   regular  Vitals Entered By: Romualdo Bolk, CMA (AAMA) (January 13, 2010 3:29 PM) CC: sore throat, some congestion, no fever or cough that has been going on for a few days.   History of Present Illness: Taylor Lee comes in today  for  acute visit .  problem with throat pain for three or so  days and no other associated  sy  , but hurts to swallow. Neck is sore a  bit on the right   NO fever no exposures and no new meds or antibioitics. ? what to take for pain.  LBP: stable but asks for meds for when flares up. ( vicodon)   Preventive Screening-Counseling & Management  Alcohol-Tobacco     Alcohol drinks/day: <1     Alcohol type: wine     Smoking Status: never  Caffeine-Diet-Exercise     Caffeine use/day: 1-2     Does Patient Exercise: yes  Current Medications (verified): 1)  Levothroid 75 Mcg  Tabs (Levothyroxine Sodium) .Marland Kitchen.. 1 By Mouth Once Daily 2)  Vicodin 5-500 Mg Tabs (Hydrocodone-Acetaminophen) .Marland Kitchen.. 1 By Mouth Every 4-6 Hours As Needed For Pain 3)  Naproxen 500 Mg  Tabs (Naproxen) .Marland Kitchen.. 1 By Mouth Two Times A Day For Pain  Allergies (verified): 1)  Darvocet-N 100  Past History:  Past medical, surgical, family and social histories (including risk factors) reviewed for relevance to current acute and chronic problems.  Past Medical History: Reviewed history from 03/05/2008 and no changes required. Depression Rheumatic Fever Thyroid Disease hnp l5-S1  Consults Dr. Allegra Grana CV consult  4/09   Past Surgical History: Reviewed history from 10/10/2006 and no changes required. Denies surgical history  Past History:  Care Management: Neurosurgery: Venetia Maxon Chiro:  Fonke  Family History: Reviewed history from 07/14/2009 and no changes required. Family History of Sudden Death Family History of Cardiovascular disorder  Mom  age 53  hip fracture      Social History: Reviewed history from 07/14/2009 and no changes required. Married Never Smoked Alcohol use-yes Drug use-no Regular exercise-yes dog not sick   mom had hip fracture  was caretaker but doing well.     Review of Systems  The patient denies anorexia, fever, weight loss, weight gain, chest pain, dyspnea on exertion, prolonged cough, abdominal pain, muscle weakness, and abnormal bleeding.    Physical Exam  General:  Well-developed,well-nourished,in no acute distress; alert,appropriate and cooperative throughout examination Head:  Normocephalic and atraumatic without obvious abnormalities. No apparent alopecia or balding. Eyes:  clear  Ears:  R ear normal, L ear normal, and no external deformities.   Nose:  clear  Mouth:  throat  right side 1+ red   with some swelling  right uvula no ulcer  faint white? tinge    good airway  Neck:  No deformities, masses, or noted. Mild tenderness right  Lungs:  normal respiratory effort, no intercostal retractions, no accessory muscle use, normal breath sounds, and no dullness.   Heart:  normal rate and regular rhythm.   Pulses:  nl cap refill  Neurologic:  alert &  oriented X3 and gait normal.   Skin:  turgor normal, color normal, no ecchymoses, and no petechiae.   Cervical Nodes:  see neck exam  Psych:  Oriented X3, normally interactive, good eye contact, not anxious appearing, and not depressed appearing.     Impression & Recommendations:  Problem # 1:  SORE THROAT (ICD-462) isolated and seems mostly right  and possibley involving th uvula  but no other alarm features   could be early ulcerative and Expectant management culture pending.  symptoms rx for now  ok to get shingles shot when recovering  The following medications were removed from  the medication list:    Metronidazole 500 Mg Tabs (Metronidazole) .Marland Kitchen... 1 by mouth three times a day Her updated medication list for this problem includes:    Naproxen 500 Mg Tabs (Naproxen) .Marland Kitchen... 1 by mouth two times a day for pain  Orders: Rapid Strep (54270) Specimen Handling (62376) T-Culture, Throat ([id number])  Problem # 2:  BACK PAIN WITH RADICULOPATHY (ICD-729.2) Assessment: Unchanged stable but asks for  refill pain meds as needed  last refill 6 months ago.   will continue  no alarm features  Complete Medication List: 1)  Levothroid 75 Mcg Tabs (Levothyroxine sodium) .Marland Kitchen.. 1 by mouth once daily 2)  Vicodin 5-500 Mg Tabs (Hydrocodone-acetaminophen) .Marland Kitchen.. 1 by mouth every 4-6 hours as needed for pain 3)  Naproxen 500 Mg Tabs (Naproxen) .Marland Kitchen.. 1 by mouth two times a day for pain  Patient Instructions: 1)  You will be informed of lab  throat culture  results when available.  2)  In the meantime  symptomatic  rx  salt water gargles and advil type meds if needed. 3)  call if fever  getting worse. Prescriptions: VICODIN 5-500 MG TABS (HYDROCODONE-ACETAMINOPHEN) 1 by mouth every 4-6 hours as needed for pain  #40 x 0   Entered and Authorized by:   Madelin Headings MD   Signed by:   Madelin Headings MD on 01/13/2010   Method used:   Print then Give to Patient   RxID:   [id number]    Orders Added: 1)  Rapid Strep [70350] 2)  Specimen Handling [99000] 3)  T-Culture, Throat [[id number]] 4)  Est. Patient Level IV [71696]    Laboratory Results    Other Tests  Rapid Strep: negative Comments: Rita Ohara  January 13, 2010 3:50 PM   Kit Test Internal QC: Negative   (Normal Range: Negative)

## 2010-02-19 NOTE — Assessment & Plan Note (Signed)
Summary: inj//ccm   Nurse Visit   Allergies: 1)  Darvocet-N 100  Immunizations Administered:  Zostavax # 1:    Vaccine Type: Zostavax    Site: left deltoid    Mfr: Merck    Dose: 0.5 ml    Route: [place]    Given by: Romualdo Bolk, CMA (AAMA)    Exp. Date: 11/23/2010    Lot #: [id number]  Orders Added: 1)  Zoster (Shingles) Vaccine Live [90736] 2)  Admin 1st Vaccine [04540]

## 2010-02-19 NOTE — Progress Notes (Signed)
Summary: throat no better  Phone Note Call from Patient   Caller: Patient Call For: Madelin Headings MD Complaint: Cough/Sore throat Summary of Call: Requesting throat culture results and to let Dr. Fabian Sharp know she is NO better. Throat is still sore, No fever or other symptoms. Is not taking any RX for this.  [phone] Initial call taken by: Lynann Beaver CMA AAMA,  January 16, 2010 1:19 PM  Follow-up for Phone Call        please see my  report for culture negative done earlier today.  Does she have ulcers in her throat and any fever? . I f she wants to try  can try gargling with Magic MW  5 cc every 2-4 hours ifneeded.  8 oz   and  be seen   in sat clinic Follow-up by: Madelin Headings MD,  January 16, 2010 4:35 PM  Additional Follow-up for Phone Call Additional follow up Details #1::        spoke with husband - pt not at home - informed culture neg - , magic mw called out to pharmacy , no fever indicated at this time, if signs worse - can call saturday clinic to be seen.KIK Additional Follow-up by: Duard Brady LPN,  January 16, 2010 4:54 PM    New/Updated Medications: * MAGIC MOUTH WASH gargle with 5 cc eveery 2-4 hrs as needed throat pain Prescriptions: MAGIC MOUTH WASH gargle with 5 cc eveery 2-4 hrs as needed throat pain  #8oz x 0   Entered by:   Duard Brady LPN   Authorized by:   Madelin Headings MD   Signed by:   Duard Brady LPN on 46/96/2952   Method used:   Historical   RxID:   [id number]

## 2010-04-05 LAB — COMPREHENSIVE METABOLIC PANEL
AST: 48 U/L — ABNORMAL HIGH (ref 0–37)
CO2: 25 mEq/L (ref 19–32)
Calcium: 9 mg/dL (ref 8.4–10.5)
Chloride: 110 mEq/L (ref 96–112)
Creatinine, Ser: 0.79 mg/dL (ref 0.4–1.2)
GFR calc Af Amer: >60 mL/min (ref >60)
GFR calc non Af Amer: >60 mL/min (ref >60)
Glucose, Bld: 108 mg/dL — ABNORMAL HIGH (ref 70–99)
Total Bilirubin: 0.3 mg/dL (ref 0.3–1.2)

## 2010-04-05 LAB — URINALYSIS, ROUTINE W REFLEX MICROSCOPIC
Bilirubin Urine: NEGATIVE
Glucose, UA: NEGATIVE mg/dL
Ketones, ur: NEGATIVE mg/dL
Nitrite: NEGATIVE
Protein, ur: NEGATIVE mg/dL
Specific Gravity, Urine: 1.02 (ref 1.005–1.030)
Urobilinogen, UA: 0.2 mg/dL (ref 0.0–1.0)
pH: 5 (ref 5.0–8.0)

## 2010-04-05 LAB — CBC
HCT: 38.7 % (ref 36.0–46.0)
Hemoglobin: 13.7 g/dL (ref 12.0–15.0)
MCH: 30.9 pg (ref 26.0–34.0)
MCHC: 35.5 g/dL (ref 30.0–36.0)
MCV: 87.2 fL (ref 78.0–100.0)
Platelets: 306 10*3/uL (ref 150–400)
RBC: 4.44 MIL/uL (ref 3.87–5.11)
RDW: 12.6 % (ref 11.5–15.5)
WBC: 8.5 10*3/uL (ref 4.0–10.5)

## 2010-04-05 LAB — DIFFERENTIAL
Basophils Absolute: 0.1 10*3/uL (ref 0.0–0.1)
Eosinophils Absolute: 0.1 10*3/uL (ref 0.0–0.7)
Eosinophils Relative: 1 % (ref 0–5)
Lymphocytes Relative: 18 % (ref 12–46)
Lymphs Abs: 1.5 10*3/uL (ref 0.7–4.0)
Neutrophils Relative %: 72 % (ref 43–77)

## 2010-04-05 LAB — LIPASE, BLOOD: Lipase: 26 U/L (ref 11–59)

## 2010-04-05 LAB — URINE CULTURE
Colony Count: NO GROWTH
Culture: NO GROWTH

## 2010-04-05 LAB — PROTIME-INR: Prothrombin Time: 13.6 seconds (ref 11.6–15.2)

## 2010-04-05 LAB — POCT CARDIAC MARKERS
CKMB, poc: 5 ng/mL (ref 1.0–8.0)
Troponin i, poc: <0.05 ng/mL (ref 0.00–0.09)

## 2010-04-05 LAB — APTT: aPTT: 37 seconds (ref 24–37)

## 2010-04-05 LAB — HEMOCCULT GUIAC POC 1CARD (OFFICE): Fecal Occult Bld: NEGATIVE

## 2010-04-05 LAB — URINE MICROSCOPIC-ADD ON

## 2010-04-28 ENCOUNTER — Other Ambulatory Visit: Payer: Self-pay | Admitting: Internal Medicine

## 2010-04-28 NOTE — Telephone Encounter (Signed)
rx sent to pharmacy and letter sent to pt to schedule a cpx.

## 2010-06-05 NOTE — Consult Note (Signed)
[facility]                          ENDOCRINOLOGY CONSULTATION   NAME:Taylor Lee, Taylor Lee                   MRN:          [id number]  DATE:03/29/2006                            DOB:          August 02, 1950    REASON FOR VISIT:  Follow-up thyroid.   HISTORY OF PRESENT ILLNESS:  Regarding her hypothyroidism, she takes  Synthroid 50 mcg a day and feels well in general.   Regarding her thyroid nodule, she had a biopsy in 2005 which was  negative and she does not notice the nodule.  She also states that she  has a history of dyslipidemia and asked can I recheck this as she is  fasting today.   PAST MEDICAL HISTORY:  Otherwise healthy, she takes no other  medications.   REVIEW OF SYSTEMS:  Denies difficulty swallowing or breathing.   PHYSICAL EXAMINATION:  VITAL SIGNS:  Blood pressure 118/74, heart rate  64, temperature 98.0, weight 190.  GENERAL:  No distress.  Thyroid slightly enlarged with an irregular  surface.   LABORATORY DATA:  On March 28, 2005; TSH 3.11, cholesterol 204, LDL 129.   IMPRESSION:  1. History of thyroid adenoma.  2. Well controlled hypothyroidism.  3. Dyslipidemia.   PLAN:  1. Same Synthroid.  2. Recheck thyroid ultrasound to see if there has been any change in      the nodule and question the development of any new nodules.  3. I left the patient a message advising her to consider statin      therapy and I would be happy to prescribe this for her, but she      should follow this up with Burna Mortimer K. Panosh, M.D.     Sean A. Everardo All, MD  Electronically Signed    SAE/MedQ  DD: 04/03/2006  DT: 04/04/2006  Job #: [id number]   cc:   Neta Mends. Fabian Sharp, MD

## 2010-06-05 NOTE — Op Note (Signed)
NAME:  Taylor Lee, Taylor Lee                        ACCOUNT NO.:  [id number]   MEDICAL RECORD NO.:  [id number]                   PATIENT TYPE:  AMB   LOCATION:  ENDO                                 FACILITY:  [facility]   PHYSICIAN:  Anselmo Rod, M.D.               DATE OF BIRTH:  December 09, 1950   DATE OF PROCEDURE:  02/04/2003  DATE OF DISCHARGE:                                 OPERATIVE REPORT   PROCEDURE PERFORMED:  Screening colonoscopy.   ENDOSCOPIST:  Charna Elizabeth, M.D.   INSTRUMENT USED:  Olympus video colonoscope.   INDICATIONS FOR PROCEDURE:  Rectal bleeding in a 60 year old white female.  Rule out colonic polyps, masses, etc.   PREPROCEDURE PREPARATION:  Informed consent was procured from the patient.  The patient was fasted for eight hours prior to the procedure and prepped  with a bottle of magnesium citrate and a gallon of GoLYTELY the night prior  to the procedure.   PREPROCEDURE PHYSICAL:  The patient had stable vital signs.  Neck supple.  Chest clear to auscultation.  S1 and S2 regular.  Abdomen soft with normal  bowel sounds.   DESCRIPTION OF PROCEDURE:  The patient was placed in left lateral decubitus  position and sedated with 80 mg of Demerol and 8 mg of Versed in slow  incremental doses.  Once the patient was adequately sedated and maintained  on low flow oxygen and continuous cardiac monitoring, the Olympus video  colonoscope was advanced from the rectum to the cecum and terminal ileum  without difficulty.  The appendicular orifice and ileocecal valve were  clearly visualized and photographed.  Small internal hemorrhoids were seen  on retroflexion.  The patient tolerated the procedure well without  complications.  There was no evidence of masses, polyps or diverticulosis.   IMPRESSION:  Normal colonoscopy up to the terminal ileum except for small  internal hemorrhoids.   RECOMMENDATIONS:  1. Continue high fiber diet with liberal fluid intake.  2. Repeat  colorectal cancer screening is recommended in the next 10 years     unless the patient develops any abnormal symptoms in the interim.  3. Outpatient followup as need arises in the future.                                               Anselmo Rod, M.D.    JNM/MEDQ  D:  02/04/2003  T:  02/04/2003  Job:  [id number]   cc:   Pershing Cox, M.D.  [address]  [address]  [place]  [place] [address]  Fax: [phone]

## 2010-06-19 ENCOUNTER — Telehealth: Payer: Self-pay | Admitting: Internal Medicine

## 2010-06-19 NOTE — Telephone Encounter (Signed)
Pt has sch cpx for 10/07/10 and said that she has had labs done March 7,2012 for health screening. Pt is wondering is she can bring the labs result to her cpx or does she need to get cpx labs redrawn? Pt also said that she has had a bone scan done and she can bring those results as well.

## 2010-06-19 NOTE — Telephone Encounter (Signed)
Per Dr. Fabian Sharp- okay to do.   Left message for pt to call back.

## 2010-06-25 NOTE — Telephone Encounter (Signed)
Left message for pt to call back  °

## 2010-06-25 NOTE — Telephone Encounter (Signed)
Pt aware of this. 

## 2010-07-29 ENCOUNTER — Other Ambulatory Visit: Payer: Self-pay | Admitting: Internal Medicine

## 2010-10-07 ENCOUNTER — Ambulatory Visit (INDEPENDENT_AMBULATORY_CARE_PROVIDER_SITE_OTHER): Payer: BC Managed Care – PPO | Admitting: Internal Medicine

## 2010-10-07 ENCOUNTER — Encounter: Payer: Self-pay | Admitting: Internal Medicine

## 2010-10-07 VITALS — BP 120/80 | HR 72 | Ht 65.0 in | Wt 198.0 lb

## 2010-10-07 DIAGNOSIS — E039 Hypothyroidism, unspecified: Secondary | ICD-10-CM

## 2010-10-07 DIAGNOSIS — Z136 Encounter for screening for cardiovascular disorders: Secondary | ICD-10-CM

## 2010-10-07 DIAGNOSIS — IMO0002 Reserved for concepts with insufficient information to code with codable children: Secondary | ICD-10-CM

## 2010-10-07 DIAGNOSIS — Z0000 Encounter for general adult medical examination without abnormal findings: Secondary | ICD-10-CM

## 2010-10-07 DIAGNOSIS — E669 Obesity, unspecified: Secondary | ICD-10-CM

## 2010-10-07 DIAGNOSIS — E782 Mixed hyperlipidemia: Secondary | ICD-10-CM

## 2010-10-07 LAB — CBC WITH DIFFERENTIAL/PLATELET
Basophils Relative: 1.1 % (ref 0.0–3.0)
Eosinophils Absolute: 0.1 10*3/uL (ref 0.0–0.7)
Hemoglobin: 13.5 g/dL (ref 12.0–15.0)
Lymphs Abs: 1.8 10*3/uL (ref 0.7–4.0)
MCHC: 33.8 g/dL (ref 30.0–36.0)
MCV: 89.8 fl (ref 78.0–100.0)
Monocytes Absolute: 0.5 10*3/uL (ref 0.1–1.0)
Neutro Abs: 4.8 10*3/uL (ref 1.4–7.7)
RBC: 4.44 Mil/uL (ref 3.87–5.11)
RDW: 12.8 % (ref 11.5–14.6)

## 2010-10-07 LAB — BASIC METABOLIC PANEL
BUN: 15 mg/dL (ref 6–23)
Calcium: 9.3 mg/dL (ref 8.4–10.5)
Creatinine, Ser: 0.7 mg/dL (ref 0.4–1.2)
GFR: 86.07 mL/min (ref >60.00)

## 2010-10-07 LAB — HEPATIC FUNCTION PANEL
AST: 34 U/L (ref 0–37)
Alkaline Phosphatase: 97 U/L (ref 39–117)
Bilirubin, Direct: 0 mg/dL (ref 0.0–0.3)

## 2010-10-07 MED ORDER — NAPROXEN 500 MG PO TABS: 500.0000 mg | ORAL_TABLET | Freq: Two times a day (BID) | ORAL | Status: DC

## 2010-10-07 MED ORDER — HYDROCODONE-ACETAMINOPHEN 5-500 MG PO TABS: 1.0000 | ORAL_TABLET | Freq: Four times a day (QID) | ORAL | Status: DC | PRN

## 2010-10-07 MED ORDER — LEVOTHYROXINE SODIUM 75 MCG PO TABS: 75.0000 ug | ORAL_TABLET | Freq: Every day | ORAL | Status: DC

## 2010-10-07 NOTE — Patient Instructions (Addendum)
Consider getting flu shot  Continue lifestyle intervention healthy eating and exercise . Will notify you  of labs when available. If ok then check up in a year .  dexa at 65 years or as needed

## 2010-10-07 NOTE — Progress Notes (Signed)
  Subjective:    Patient ID: Taylor Lee, female    DOB: 14-Feb-1950, 60 y.o.   MRN: [id number]  HPI Patient comes in today for Preventive Health Care visit   follow up of  multiple medical problems.  Last office visit 2011  Thyroid :  No change meds   Obesity : lost weight per Dr Neil Crouch challenge but gained it back.  No hx of OSA  Back pain with sciatica  Uses meds prn meds and  chiropractic with help uses one refill per year. No current weakness or progression  Had screening tests with lipids and life line screening and had low risk bone density Sees Amy [place]   Review of Systems ROS:  GEN/ HEENTNo fever, significant weight changes sweats headaches vision problems hearing changes, CV/ PULM; No chest pain shortness of breath cough, syncope,edema  change in exercise tolerance. GI /GU: No adominal pain, vomiting, change in bowel habits. No blood in the stool. No significant GU symptoms. SKIN/HEME: ,no acute skin rashes suspicious lesions or bleeding. No lymphadenopathy, nodules, masses.  NEURO/ PSYCH:  No neurologic signs such as weakness numbness No depression anxiety. IMM/ Allergy: No unusual infections.  Allergy .   REST of 12 system review negative  Past history family history social history reviewed in the electronic medical record.     Objective:   Physical Exam Physical Exam: Vital signs reviewed WUJ:WJXB is a well-developed well-nourished alert cooperative  white female who appears her stated age in no acute distress.  HEENT: normocephalic  traumatic , Eyes: PERRL EOM's full, conjunctiva clear, Nares: paten,t no deformity discharge or tenderness., Ears: no deformity EAC's clear TMs with normal landmarks. Mouth: clear OP, no lesions, edema.  Moist mucous membranes. Dentition in adequate repair. NECK: supple without masses, thyromegaly or bruits. CHEST/PULM:  Clear to auscultation and percussion breath sounds equal no wheeze , rales or rhonchi. No chest wall deformities or  tenderness. CV: PMI is nondisplaced, S1 S2 no gallops, murmurs, rubs. Peripheral pulses are full without delay.No JVD .  Breast: normal by inspection . No dimpling, discharge, masses, tenderness or discharge . LN: no cervical axillary inguinal adenopathy  ABDOMEN: Bowel sounds normal nontender  No guard or rebound, no hepato splenomegal no CVA tenderness.  No hernia. Extremtities:  No clubbing cyanosis or edema, no acute joint swelling or redness no focal atrophy NEURO:  Oriented x3, cranial nerves 3-12 appear to be intact, no obvious focal weakness,gait within normal limits no abnormal reflexes or asymmetrical SKIN: No acute rashes normal turgor, color, no bruising or petechiae. sunchanges PSYCH: Oriented, good eye contact, no obvious depression anxiety, cognition and judgment appear normal. EKG nsr low voltage prob from large chest wall no acute changes Reviewed  lab done at screening  tc 175 hdl 38 and ldl 102 bg 97 tg 175    Assessment & Plan:  Preventive Health Care Counseled regarding healthy nutrition, exercise, sleep, injury prevention, calcium vit d and healthy weight .  Back pain with  Radiculopathy  prn.  Pain meds and chiro with no alarm sx for now.  Counseled. About alarm sx and further work up Thyroid : on going  meds  Monitor meds  Obesity  : Counseled.    About strategies.

## 2010-10-08 ENCOUNTER — Telehealth: Payer: Self-pay | Admitting: *Deleted

## 2010-10-08 NOTE — Telephone Encounter (Signed)
Message copied by Romualdo Bolk on Thu Oct 08, 2010  2:27 PM ------      Message from: [facility], [place] [facility]      Created: Thu Oct 08, 2010 11:14 AM       Advise patient normal labs.      manufact back order change to synthroid same dose.  No need to recheck lab at this time but if feels tired or different before next yearly lab then call and we can repeat  TSH

## 2010-10-08 NOTE — Telephone Encounter (Signed)
Left message to call back  

## 2010-10-09 NOTE — Telephone Encounter (Signed)
Left message to call back  

## 2010-10-09 NOTE — Telephone Encounter (Signed)
Pt aware of this. 

## 2010-12-07 ENCOUNTER — Telehealth: Payer: Self-pay | Admitting: Internal Medicine

## 2010-12-07 NOTE — Telephone Encounter (Signed)
Pts new thyroid med is not working. Pts mouth is dry and she is not sleeping well. Pt is req to change back to her original thyroid med that she has been on for yrs. Pls call in to [facility] at [facility] If pt needs to come in for labs, pls advise.

## 2010-12-08 ENCOUNTER — Telehealth: Payer: Self-pay | Admitting: Family Medicine

## 2010-12-08 MED ORDER — LEVOTHROID 75 MCG PO TABS: 75.0000 ug | ORAL_TABLET | Freq: Every day | ORAL | Status: DC

## 2010-12-08 NOTE — Telephone Encounter (Signed)
I do not see in EHR med change  Call patient and clarify this message .

## 2010-12-08 NOTE — Telephone Encounter (Signed)
Please call to clarify levothroid rx. Thanks

## 2010-12-08 NOTE — Telephone Encounter (Signed)
Pt was changed from Brand Synthroid to generic levothyroxine. Rx sent to pharmacy

## 2010-12-16 ENCOUNTER — Telehealth: Payer: Self-pay | Admitting: Internal Medicine

## 2010-12-16 NOTE — Telephone Encounter (Signed)
Pt called and said that the Levothyroxine 75 mg is causing pt to have dry mouth and feel anxious. Pt is req to go back on LEVOTHROID 75 MCG tablet. Pt said that her pharmacy told pt that this med is on back order, but pt says that the [facility] on [address] has medicine available. Pls call [facility] on [address] and see if pt can get med called in there for Levothroid.

## 2010-12-17 ENCOUNTER — Other Ambulatory Visit: Payer: Self-pay | Admitting: Dermatology

## 2010-12-17 MED ORDER — LEVOTHROID 75 MCG PO TABS: 75.0000 ug | ORAL_TABLET | Freq: Every day | ORAL | Status: DC

## 2010-12-17 NOTE — Telephone Encounter (Signed)
Rx sent for Brand only

## 2010-12-18 NOTE — Telephone Encounter (Signed)
Left message on machine that pt wants brand medication.

## 2010-12-22 ENCOUNTER — Telehealth: Payer: Self-pay | Admitting: Family Medicine

## 2010-12-22 NOTE — Telephone Encounter (Signed)
Pt called and says that her brand name thyroid med is NOT available from the pharmacy. It is on back order. She states she "does not feel right". Please advise. Can we get her samples, find somewhere we can get this med, or have her come in? Please call pt ASAP.

## 2010-12-22 NOTE — Telephone Encounter (Signed)
No samples in the building. Advised pt to call around [facility] or a mom and pop type pharmacy. To see who has it then have her pharmacy transfer the rx to them.

## 2011-01-25 ENCOUNTER — Telehealth: Payer: Self-pay | Admitting: Internal Medicine

## 2011-01-25 MED ORDER — SYNTHROID 75 MCG PO TABS: 75.0000 ug | ORAL_TABLET | Freq: Every day | ORAL | Status: DC

## 2011-01-25 NOTE — Telephone Encounter (Signed)
Pt is requesting shannon to callback

## 2011-01-25 NOTE — Telephone Encounter (Signed)
Pt called back again about refill and is very worried because she has been off it for several day and is hoping that she can get it called in today to [facility]

## 2011-01-25 NOTE — Telephone Encounter (Signed)
Pt called and said that levothroid is on back order at [facility]. Pt can not take Levothyroxine because it makes pt sick. Pt is req to get a scrip for Synthroid called in to [facility] or get a written script for Levothroid to take over to [facility]. Pt is been out of meds for 4 days.

## 2011-01-25 NOTE — Telephone Encounter (Signed)
Rx sent to pharmacy for Brand Synthroid .

## 2011-02-12 ENCOUNTER — Encounter: Payer: Self-pay | Admitting: Family

## 2011-02-12 ENCOUNTER — Ambulatory Visit (INDEPENDENT_AMBULATORY_CARE_PROVIDER_SITE_OTHER): Payer: BC Managed Care – PPO | Admitting: Family

## 2011-02-12 VITALS — BP 136/72 | HR 94 | Temp 98.9°F | Resp 16 | Wt 194.0 lb

## 2011-02-12 DIAGNOSIS — R05 Cough: Secondary | ICD-10-CM

## 2011-02-12 DIAGNOSIS — J069 Acute upper respiratory infection, unspecified: Secondary | ICD-10-CM

## 2011-02-12 DIAGNOSIS — E782 Mixed hyperlipidemia: Secondary | ICD-10-CM

## 2011-02-12 DIAGNOSIS — R059 Cough, unspecified: Secondary | ICD-10-CM

## 2011-02-12 MED ORDER — GUAIFENESIN-CODEINE 100-10 MG/5ML PO SYRP: 5.0000 mL | ORAL_SOLUTION | Freq: Three times a day (TID) | ORAL | Status: AC | PRN

## 2011-02-12 MED ORDER — AMOXICILLIN-POT CLAVULANATE 875-125 MG PO TABS: 1.0000 | ORAL_TABLET | Freq: Two times a day (BID) | ORAL | Status: AC

## 2011-02-12 NOTE — Progress Notes (Signed)
Subjective:    Patient ID: Taylor Lee, female    DOB: 20-Feb-1950, 61 y.o.   MRN: [id number]  HPI 61 year old white female, nonsmoker, patient of Dr. Maisie Fus is in today with complaints of sore throat, cough, congestion x1 month. She was diagnosed with influenza last month, got better, but upper respiratory symptoms began about a week later. She's been taking over-the-counter NyQuil with no relief. Cough is productive with green phlegm. She denies any lightheadedness, dizziness, shortness of breath, chest pain, palpitations or edema.   Review of Systems  Constitutional: Positive for fever and fatigue.  HENT: Positive for congestion, sneezing, voice change, postnasal drip and sinus pressure.   Eyes: Positive for redness and itching.  Respiratory: Positive for cough.   Cardiovascular: Negative.   Genitourinary: Negative.   Musculoskeletal: Negative.   Skin: Negative.   Hematological: Negative.    Past Medical History  Diagnosis Date  . Depression   . Rheumatic fever   . Thyroid disease   . HNP (herniated nucleus pulposus)     I5-S1    History   Social History  . Marital Status: Married    Spouse Name: N/A    Number of Children: N/A  . Years of Education: N/A   Occupational History  . Not on file.   Social History Main Topics  . Smoking status: Never Smoker   . Smokeless tobacco: Not on file  . Alcohol Use: Yes  . Drug Use: No  . Sexually Active: Not on file   Other Topics Concern  . Not on file   Social History Narrative   MarriedRegular exercise- yesDog not sickMom had hip fracture was caretaker but doing well.    Past Surgical History  Procedure Date  . No past surgeries     Family History  Problem Relation Age of Onset  . Hip fracture Mother   . Sudden death    . Heart disease      Allergies  Allergen Reactions  . Levothyroxine     Generics only cause dry mouth and not sleeping. Pt must have brand on this medication.  . Propoxyphene  N-Acetaminophen     REACTION: unspecified    Current Outpatient Prescriptions on File Prior to Visit  Medication Sig Dispense Refill  . HYDROcodone-acetaminophen (VICODIN) 5-500 MG per tablet Take 1 tablet by mouth every 6 (six) hours as needed.  30 tablet  0  . naproxen (NAPROSYN) 500 MG tablet Take 1 tablet (500 mg total) by mouth 2 (two) times daily with a meal.  60 tablet  1  . SYNTHROID 75 MCG tablet Take 1 tablet (75 mcg total) by mouth daily.  30 tablet  5    BP 136/72  Pulse 94  Temp(Src) 98.9 F (37.2 C) (Oral)  Resp 16  Wt 194 lb (87.998 kg)  SpO2 96%chart      Objective:   Physical Exam  Constitutional: She is oriented to person, place, and time. She appears well-developed and well-nourished.  HENT:  Right Ear: External ear normal.  Left Ear: External ear normal.  Nose: Nose normal.  Mouth/Throat: Oropharynx is clear and moist.       Pharynx moderately red  Neck: Normal range of motion. Neck supple.  Cardiovascular: Normal rate, regular rhythm and normal heart sounds.   Pulmonary/Chest: Effort normal and breath sounds normal.  Abdominal: Soft.  Musculoskeletal: Normal range of motion.  Neurological: She is alert and oriented to person, place, and time.  Skin: Skin is warm and dry.  Psychiatric: She has a normal mood and affect.          Assessment & Plan:  Assessment: Upper respiratory infection, cough, hyperlipidemia  Plan: Augmentin 875 one by mouth twice a day x10 days. Over-the-counter symptomatic treatment for relief. Finally office is symptoms worsen or persist.

## 2011-02-12 NOTE — Patient Instructions (Signed)

## 2011-03-08 ENCOUNTER — Telehealth: Payer: Self-pay | Admitting: Internal Medicine

## 2011-03-08 DIAGNOSIS — E039 Hypothyroidism, unspecified: Secondary | ICD-10-CM

## 2011-03-08 NOTE — Telephone Encounter (Signed)
Order placed in epic

## 2011-03-08 NOTE — Telephone Encounter (Signed)
Ok to  Do tsh dx hypothyroid pre visit.

## 2011-03-08 NOTE — Telephone Encounter (Signed)
Pt would like to have her tsh check before ov on 03-12-2011. Can I sch?

## 2011-03-09 NOTE — Telephone Encounter (Addendum)
lmom for pt to call back

## 2011-03-09 NOTE — Telephone Encounter (Signed)
Pt is sch for 03-10-11 215pm

## 2011-03-10 ENCOUNTER — Other Ambulatory Visit (INDEPENDENT_AMBULATORY_CARE_PROVIDER_SITE_OTHER): Payer: BC Managed Care – PPO

## 2011-03-10 DIAGNOSIS — E039 Hypothyroidism, unspecified: Secondary | ICD-10-CM

## 2011-03-10 LAB — TSH: TSH: 1.54 u[IU]/mL (ref 0.35–5.50)

## 2011-03-12 ENCOUNTER — Ambulatory Visit (INDEPENDENT_AMBULATORY_CARE_PROVIDER_SITE_OTHER): Payer: BC Managed Care – PPO | Admitting: Internal Medicine

## 2011-03-12 ENCOUNTER — Encounter: Payer: Self-pay | Admitting: Internal Medicine

## 2011-03-12 VITALS — BP 130/80 | HR 72 | Wt 196.0 lb

## 2011-03-12 DIAGNOSIS — E039 Hypothyroidism, unspecified: Secondary | ICD-10-CM

## 2011-03-12 DIAGNOSIS — J029 Acute pharyngitis, unspecified: Secondary | ICD-10-CM | POA: Insufficient documentation

## 2011-03-12 NOTE — Patient Instructions (Signed)
Your thyroid is  Normal now

## 2011-03-12 NOTE — Progress Notes (Signed)
  Subjective:    Patient ID: Taylor Lee, female    DOB: [birth year], 61 y.o.   MRN: [id number]  HPI Pt comes in today for a couple of concerns. Got better from last illness after  Augmentin . Over the last weeks got  Sore throatt that comes and goes .Sore throat and jumps from  Right to left    Bothers  All day.  Some cough no fever or sinus pain. Was with grandkids who got ill with st and cough . No strep noted  Worried about her thyroid and now on synthroid.  Lab done    Review of Systems No fever sob new rash  Past history family history social history reviewed in the electronic medical record.     Objective:   Physical Exam  WDWn in nad HEENT: Normocephalic ;atraumatic , Eyes;  PERRL, EOMs  Full, lids and conjunctiva clear,,Ears: no deformities, canals nl, TM landmarks normal, Nose: no deformity or discharge  Mouth : OP clear without lesion or edema . 1+ redness no lesion.  Neck: Supple without adenopathy or masses thyroid palpable Chest:  Clear to A&P without wheezes rales or rhonchi CV:  S1-S2 no gallops or murmurs peripheral perfusion is normal Lab Results  Component Value Date   TSH 1.54 03/10/2011        Assessment & Plan:  Sore throat prob viral  And  Self resolving.  Symptomatic rx  And   Expectant management.   Hypothyroid  Stable no change

## 2011-04-09 ENCOUNTER — Other Ambulatory Visit (INDEPENDENT_AMBULATORY_CARE_PROVIDER_SITE_OTHER): Payer: BC Managed Care – PPO

## 2011-04-09 ENCOUNTER — Ambulatory Visit (INDEPENDENT_AMBULATORY_CARE_PROVIDER_SITE_OTHER)
Admission: RE | Admit: 2011-04-09 | Discharge: 2011-04-09 | Disposition: A | Discharge status: Home / Self Care | Payer: BC Managed Care – PPO | Source: Ambulatory Visit | Attending: Internal Medicine | Admitting: Internal Medicine

## 2011-04-09 ENCOUNTER — Telehealth: Payer: Self-pay | Admitting: Internal Medicine

## 2011-04-09 ENCOUNTER — Encounter: Payer: Self-pay | Admitting: Internal Medicine

## 2011-04-09 ENCOUNTER — Ambulatory Visit (INDEPENDENT_AMBULATORY_CARE_PROVIDER_SITE_OTHER): Payer: BC Managed Care – PPO | Admitting: Internal Medicine

## 2011-04-09 VITALS — BP 110/74 | HR 94 | Temp 98.7°F | Wt 197.0 lb

## 2011-04-09 DIAGNOSIS — R059 Cough, unspecified: Secondary | ICD-10-CM

## 2011-04-09 DIAGNOSIS — R05 Cough: Secondary | ICD-10-CM

## 2011-04-09 DIAGNOSIS — J111 Influenza due to unidentified influenza virus with other respiratory manifestations: Secondary | ICD-10-CM

## 2011-04-09 DIAGNOSIS — R509 Fever, unspecified: Secondary | ICD-10-CM

## 2011-04-09 DIAGNOSIS — R6889 Other general symptoms and signs: Secondary | ICD-10-CM | POA: Insufficient documentation

## 2011-04-09 LAB — CBC WITH DIFFERENTIAL/PLATELET
Basophils Absolute: 0 10*3/uL (ref 0.0–0.1)
Eosinophils Absolute: 0 10*3/uL (ref 0.0–0.7)
Hemoglobin: 13.6 g/dL (ref 12.0–15.0)
Lymphocytes Relative: 18.3 % (ref 12.0–46.0)
MCHC: 34 g/dL (ref 30.0–36.0)
Neutro Abs: 2.8 10*3/uL (ref 1.4–7.7)
Platelets: 213 10*3/uL (ref 150.0–400.0)
RDW: 12.7 % (ref 11.5–14.6)

## 2011-04-09 MED ORDER — HYDROCODONE-HOMATROPINE 5-1.5 MG/5ML PO SYRP: 5.0000 mL | ORAL_SOLUTION | ORAL | Status: AC | PRN

## 2011-04-09 NOTE — Progress Notes (Signed)
  Subjective:    Patient ID: Taylor Lee, female    DOB: 1950-10-05, 61 y.o.   MRN: [id number]  HPI Patient comes in today for SDA for  new problem evaluation.  Acute onset of cough and then fever  Onset  3 days.  NO flu shot  This ear"cause got  Flu in the fall. " 102  Temp last pm no chills but cold feeling.   Last night .    No sob but chest hurts and right ear.  Tried  niquil  and advil  yesterday.   Has grandchildren but they haven't been sick no other exposures. Chest hurts just because of coughing so bad cough is mostly dry occasional productive no blood. Deep without wheezing. No vomiting diarrhea unusual rashes. Mild upper respiratory congestion.  Review of Systems As per hpi  No bleeding sob wheezing  Hemoptysis   No face pain.  Past history family history social history reviewed in the electronic medical record. No tobacco no hx of pna  Outpatient Prescriptions Prior to Visit  Medication Sig Dispense Refill  . SYNTHROID 75 MCG tablet Take 1 tablet (75 mcg total) by mouth daily.  30 tablet  5  .      . naproxen (NAPROSYN) 500 MG tablet Take 1 tablet (500 mg total) by mouth 2 (two) times daily with a meal.  60 tablet  1       Objective:   Physical Exam BP 110/74  Pulse 94  Temp(Src) 98.7 F (37.1 C) (Oral)  Wt 197 lb (89.359 kg)  SpO2 94% WDWN in nad  uncomfortable with a very deep bronchial cough and spasms. No stridor color is good respirations quietly in between coughing. WDWN in NAD  quiet respirations; mildly congested  somewhat hoarse. Non toxic . HEENT: Normocephalic ;atraumatic , Eyes;  PERRL, EOMs  Full, lids and conjunctiva clear,,Ears: no deformities, canals nl, TM landmarks normal, Nose: no deformity or discharge but congested;face non tender Mouth : OP clear without lesion or edema . Mild erythema  Neck: Supple without adenopathy or masses or bruits tender ac area  Chest:  Clear to A&P without wheezes rales or rhonchi CV:  S1-S2 no gallops or murmurs  peripheral perfusion is normal Skin :nl perfusion and no acute rashes      Assessment & Plan:  Acute febrile illness with dry hacking cough. Lower respiratory infection flulike. Concern about pneumonia because of complaint of fever we'll get chest x-ray and CBC today treatment based on that. No known underlying lung disease Symptomatic treatment with cough medicine fluids rest close observation and followup.   Chest x-ray and white blood cell count are normal. We'll do symptomatic treatment and close followup. She should contact medical care if she has shortness of breath or decline in her status.

## 2011-04-09 NOTE — Telephone Encounter (Signed)
Pt aware of lab results 

## 2011-04-09 NOTE — Telephone Encounter (Signed)
Pt requesting test results please contact

## 2011-04-09 NOTE — Progress Notes (Signed)
Quick Note:  Pt aware ______ 

## 2011-04-09 NOTE — Patient Instructions (Addendum)
I am concerned  About pneumonia as a diagnosis bu could be a flu illness.  Get chest x ray and blood test at [facility] and will notify you of plan.  If negative cough med advil and time.   Fever should be gone in 24- 48 hours

## 2011-05-21 ENCOUNTER — Ambulatory Visit: Payer: BC Managed Care – PPO | Admitting: Internal Medicine

## 2011-05-24 ENCOUNTER — Encounter: Payer: Self-pay | Admitting: Internal Medicine

## 2011-05-24 ENCOUNTER — Ambulatory Visit (INDEPENDENT_AMBULATORY_CARE_PROVIDER_SITE_OTHER): Payer: BC Managed Care – PPO | Admitting: Internal Medicine

## 2011-05-24 VITALS — BP 122/82 | HR 102 | Temp 98.3°F | Wt 200.0 lb

## 2011-05-24 DIAGNOSIS — R5383 Other fatigue: Secondary | ICD-10-CM

## 2011-05-24 DIAGNOSIS — R51 Headache: Secondary | ICD-10-CM

## 2011-05-24 DIAGNOSIS — R5381 Other malaise: Secondary | ICD-10-CM

## 2011-05-24 DIAGNOSIS — T148XXA Other injury of unspecified body region, initial encounter: Secondary | ICD-10-CM

## 2011-05-24 DIAGNOSIS — W57XXXA Bitten or stung by nonvenomous insect and other nonvenomous arthropods, initial encounter: Secondary | ICD-10-CM

## 2011-05-24 NOTE — Patient Instructions (Signed)
Please take the antibiotic as recommended.  Doxycycline is good for many take related infections. Not just Lyme disease or [place] spotted fever.  Please have the emergency room Hospital send [place] a copy of the laboratory results in the notes.  If this is take related disease your on the right medication.  Please take her temperature if you get shaking I do not know why you have fat reaction some people get that before their fever goes up. I need more information before I can give you more of an opinion. If you have a recurrent problem I want you to contact us.  Would suggest office visit in 2 weeks to see how you are doing. If you are well you can call us.

## 2011-05-24 NOTE — Progress Notes (Signed)
  Subjective:    Patient ID: Taylor Lee, female    DOB: 1950-03-08, 61 y.o.   MRN: [id number]  HPI Patient comes in today for an acute SDA she did have an appointment last week but didn't come for that and went to the emergency room and [place] instead  5 3 13  because she was having headaches malaise intermittent shaking for whatever reason but not particularly chills 2-3 weeks after a tick bite that she describes as a small tick on the left flank. She noticed it when she was at the beach in [place]. No unusual rashes.  She's taking the doxycycline over the weekend no side effects noted she was given an opportunity weeks her laboratory studies are pending the Lyme titer apparently was negative but was told that it was early. The rest of her lab tests are pending. She has been on line and is worried about what she has read . no acute joint pain numbness or neuro sx.    Review of Systems Negative for nausea vomiting syncope numbness she apparently has had 3 episode of shaking some when she was nervous at one time in the middle the night last 3 minutes or so says it symmetrical with no loss of consciousness she states at times in her legs. Doesn't think it's diabetes related to eating.  Past history family history social history reviewed in the electronic medical record.     Objective:   Physical Exam BP 122/82  Pulse 102  Temp(Src) 98.3 F (36.8 C) (Oral)  Wt 200 lb (90.719 kg)  SpO2 98% WDWN in nad anxious  HEENT is grossly normal neck supple without masses thyromegaly or bruits no adenopathy Chest clear to auscultation cardiac S1-S2 no gallops or murmurs abdomen soft without organomegaly skin sun changes a few papules on the lower extremity shin area and forearms but no petechiae bruising and no rashes. No acute joint redness or swelling or warmth NEURO: oriented x 3 CN 3-12 appear intact. No focal muscle weakness or atrophy. DTRs symmetrical. Gait WNL.  Grossly non focal.  No tremor or abnormal movement. Ed check out sheet but no labs to reviewed at this time      Assessment & Plan:  Hx ed visit  For malaise after tich bites and "shakes" ? If chillsn cant describe this  Uncertain cause ;very anxious today. No focal signs now  Should stay  On meds and get records to [place] from [facility]. I agree with starting her on doxycycline for 2 weeks to cover for tick-related disease.  She should contact us if there is any fever of significance in the meantime come back the end of the medicine and we can discuss if any further evaluation is needed.     addendeum May 28 2011 Labs from Ed show neg rmsf and lyme serology  Nl cbc , bmp k 3.3 and bg 173 cr .76 UA large leuk 7-10 wbcs  cx mult species  Hyperglycemia low K and  Pyuria address at follow up and prob repeat.

## 2011-05-29 DIAGNOSIS — R51 Headache: Secondary | ICD-10-CM | POA: Insufficient documentation

## 2011-05-29 DIAGNOSIS — R519 Headache, unspecified: Secondary | ICD-10-CM | POA: Insufficient documentation

## 2011-05-29 DIAGNOSIS — R5383 Other fatigue: Secondary | ICD-10-CM | POA: Insufficient documentation

## 2011-05-29 DIAGNOSIS — W57XXXA Bitten or stung by nonvenomous insect and other nonvenomous arthropods, initial encounter: Secondary | ICD-10-CM | POA: Insufficient documentation

## 2011-06-03 ENCOUNTER — Telehealth: Payer: Self-pay | Admitting: Internal Medicine

## 2011-06-03 NOTE — Telephone Encounter (Signed)
Pt called and said that she has finished doxycycline for tick bite. Pt feeling better. Pt wanted to know if she needs a follow up?

## 2011-06-03 NOTE — Telephone Encounter (Signed)
Pls advise.  

## 2011-06-03 NOTE — Telephone Encounter (Signed)
Left a message for pt to return call 

## 2011-06-03 NOTE — Telephone Encounter (Signed)
Got her labs and ok but her blood sugar was up.  To 173  And potassium slightly low rest neg   Would have her come back in to address the blood sugar  elevation .  It could be illness related but pre diabetes could do this also .

## 2011-06-04 NOTE — Telephone Encounter (Signed)
Called and spoke with pt and pt is aware.  Pt will call to make a f/u appt.

## 2011-06-04 NOTE — Telephone Encounter (Signed)
Left a message for pt to return call 

## 2011-07-05 ENCOUNTER — Telehealth: Payer: Self-pay | Admitting: Family Medicine

## 2011-07-05 NOTE — Telephone Encounter (Signed)
[facility] Triage Call Report Triage Record Num: [id number] Operator: Bennie Hind Patient Name: Taylor Lee Call Date & Time: 07/03/2011 12:49:33PM Patient Phone: [phone] PCP: Neta Mends. Panosh Patient Gender: Female PCP Fax : [phone] Patient DOB: 1950-11-13 [facility] Name[facility] Reason for Call: Caller: Tricia/Patient; PCP: Madelin Headings.; CB#: [phone]; Call regarding Has belly button pain from hernia and is very uncompfortable. ; has had some abd discomfort since around 06-19-11 but since today has become more bothersome. she still describes it as mild. she said she feels bloated but abd soft. Afebrile no vomiting. Has umbilical hernia but states no bulging She she removed a tick from her belly button 06-26-11 and for a few days has had some clear fluid draining,no rash. Has had RMSF already this summer and states doesn't feel anything like that. All emergent sxs per Abdominal Pain protocols R/O except for episodes of abdominal discomfort feeling fullness/bloating and occ nausea or vomiting that are increasing in freq. Protocol(s) Used: Abdominal Pain Recommended Outcome per Protocol: See Provider within 72 Hours Reason for Outcome: Episodes of abdominal discomfort (feeling fullness/bloating) AND occasional nausea or vomiting, that are increasing in frequency Care Advice: Avoid foods that may be responsible for increased gas production (such as dairy products, raw vegetables, nuts, spicy or fried foods, etc.). ~ ~ Eliminate or decrease the intake of alcoholic or caffeinated beverages. ~ Stop or decrease smoking. Do not take aspirin, ibuprofen, ketoprofen, naproxen, etc., or other pain relieving medications until consulting with provider. ~ ~ Eat smaller, more frequent meals; eat slowly and allow one-half hour to relax after eating. Call provider immediately if develop severe pain, black, tarry stools, bloody stools, blood-streaked or  coffee ground-looking vomitus, or abdomen swollen. ~ ~ CAUTIONS Discontinue nonprescribed medications and complementary/alternative medications. Continue prescribed medication(s) at ordered dosage/frequency until discussed with provider. ~

## 2011-07-15 ENCOUNTER — Other Ambulatory Visit: Payer: Self-pay | Admitting: Internal Medicine

## 2011-07-28 ENCOUNTER — Other Ambulatory Visit: Payer: Self-pay | Admitting: Family Medicine

## 2011-07-28 MED ORDER — LEVOTHYROXINE SODIUM 75 MCG PO TABS: 75.0000 ug | ORAL_TABLET | Freq: Every day | ORAL | Status: DC

## 2011-08-08 ENCOUNTER — Other Ambulatory Visit: Payer: Self-pay | Admitting: Internal Medicine

## 2011-08-10 ENCOUNTER — Telehealth: Payer: Self-pay | Admitting: Family Medicine

## 2011-08-10 NOTE — Telephone Encounter (Signed)
Ok to refill x 1  Disp 30 #

## 2011-08-10 NOTE — Telephone Encounter (Signed)
Pt needs refills of her Vicodin.  She keeps this on hand because she uses it when her back "goes out."  She is not having any back problems at the present moment.  Last filled 09/30/10.  Seen acutely on 05/24/11 and had a fu with you on 03/12/11.  Please advise.  Thanks!!!

## 2011-08-11 ENCOUNTER — Other Ambulatory Visit: Payer: Self-pay | Admitting: Family Medicine

## 2011-08-11 MED ORDER — HYDROCODONE-ACETAMINOPHEN 5-500 MG PO TABS: 1.0000 | ORAL_TABLET | Freq: Four times a day (QID) | ORAL | Status: DC | PRN

## 2011-08-11 NOTE — Telephone Encounter (Signed)
Called to the pharmacy.

## 2011-08-18 ENCOUNTER — Ambulatory Visit: Payer: BC Managed Care – PPO | Admitting: Internal Medicine

## 2011-08-19 ENCOUNTER — Ambulatory Visit (INDEPENDENT_AMBULATORY_CARE_PROVIDER_SITE_OTHER): Payer: BC Managed Care – PPO | Admitting: Internal Medicine

## 2011-08-19 ENCOUNTER — Encounter: Payer: Self-pay | Admitting: Internal Medicine

## 2011-08-19 VITALS — BP 142/84 | HR 80 | Temp 98.1°F | Wt 198.0 lb

## 2011-08-19 DIAGNOSIS — F419 Anxiety disorder, unspecified: Secondary | ICD-10-CM

## 2011-08-19 DIAGNOSIS — R8281 Pyuria: Secondary | ICD-10-CM

## 2011-08-19 DIAGNOSIS — F411 Generalized anxiety disorder: Secondary | ICD-10-CM

## 2011-08-19 DIAGNOSIS — R82998 Other abnormal findings in urine: Secondary | ICD-10-CM

## 2011-08-19 DIAGNOSIS — R109 Unspecified abdominal pain: Secondary | ICD-10-CM

## 2011-08-19 LAB — POCT URINALYSIS DIP (MANUAL ENTRY)
Glucose, UA: NEGATIVE
Nitrite, UA: NEGATIVE
Urobilinogen, UA: 0.2
pH, UA: 5

## 2011-08-19 MED ORDER — SULFAMETHOXAZOLE-TRIMETHOPRIM 800-160 MG PO TABS: 1.0000 | ORAL_TABLET | Freq: Two times a day (BID) | ORAL | Status: AC

## 2011-08-19 NOTE — Progress Notes (Signed)
Subjective:    Patient ID: Taylor Lee, female    DOB: 12/20/1950, 61 y.o.   MRN: [id number]  HPI Patient comes in today for SDA for  new problem evaluation. Onset about a month ago of periumbilical pain comes and goes worse after eating getting worse over the last week he just feels bad no UTI symptoms or flank pain however is had a UTI with similar symptoms she has a small umbilical hernia per her GYN wonders if that could be the problem. No worsening with coughing sneezing. Doesn't like med so not trying any remedy at this point.   No reflux vomiting change in bowel habits. But she has bloating after eating.  She has a history of a fatty liver but no ulcer disease or bleeding not taking a lot of medication for this. Only takes the hydrocodone as needed for her back not a lot.  She also brought up wanted to discuss problem with anxiety that she's had panic attacks long time ago was on medicine at some point but doesn't want to be on it now is starting to feel some of his feelings come back. She has avoided going to doctors because she feels they're going to get on her about her weight because she hasn't been able to lose weight.  Outpatient Encounter Prescriptions as of 08/19/2011  Medication Sig Dispense Refill  . HYDROcodone-acetaminophen (VICODIN) 5-500 MG per tablet Take 1 tablet by mouth every 6 (six) hours as needed.  30 tablet  0  . levothyroxine (SYNTHROID) 75 MCG tablet Take 1 tablet (75 mcg total) by mouth daily.  30 tablet  2  . DISCONTD: doxycycline (VIBRAMYCIN) 100 MG capsule Take 100 mg by mouth 2 (two) times daily.      Marland Kitchen DISCONTD: naproxen (NAPROSYN) 500 MG tablet Take 1 tablet (500 mg total) by mouth 2 (two) times daily with a meal.  60 tablet  1    Review of Systems Neg cp sob  Vomiting blood in stool  No hematuria  Anxiety as above  No vision hearing uri sx     Objective:   Physical Exam BP 142/84  Pulse 80  Temp 98.1 F (36.7 C) (Oral)  Wt 198 lb (89.812  kg) Physical Exam: Vital signs reviewed NWG:NFAO is a well-developed well-nourished alert cooperative  white female who appears her stated age in no acute distress. She is nontoxic and not acutely ill NECK: supple without masses, thyromegaly or bruits. CHEST/PULM:  Clear to auscultation CV: PMI is nondisplaced, S1 S2 no gallops, murmurs, rubs. Peripheral pulses are full without delay.No JVD .  ABDOMEN: Bowel sounds normal nontender  No guard or rebound, no hepato splenomegal no CVA tenderness.  Umbilical area has an out ward  area but isn't really tender or.  Extremtities:  No clubbing cyanosis or edema, no acute joint swelling or redness no focal atrophy NEURO:  Oriented x3, cranial nerves 3-12 appear to be intact, no obvious focal weakness,gait within normal limitsSKIN: No acute rashes normal turgor, color, no bruising or petechiae. PSYCH: Oriented, good eye contact, mild anxiety anxiety, cognition and judgment appear normal. LN: no cervicaldenopathy Back bothering her when she lays down and rises  Lab Results  Component Value Date   WBC 4.2* 04/09/2011   HGB 13.6 04/09/2011   HCT 40.0 04/09/2011   PLT 213.0 04/09/2011   GLUCOSE 86 10/07/2010   CHOL 205* 06/28/2007   TRIG 114 06/28/2007   HDL 51.6 06/28/2007   LDLDIRECT 143.1 06/28/2007  LDLCALC 128* 12/07/2006   ALT 34 10/07/2010   AST 34 10/07/2010   NA 141 10/07/2010   K 4.5 10/07/2010   CL 106 10/07/2010   CREATININE 0.7 10/07/2010   BUN 15 10/07/2010   CO2 26 10/07/2010   TSH 1.54 03/10/2011   INR 1.05 07/20/2009   ua pos leuk neg nitrites     Assessment & Plan:  Abdominal pain for one month postprandial woke her up last night. Check for UTI  Abnormal ua  Treat and culture   abdominal ultrasound if continues : doesn't really sound like a hernia pain not worse with coughing or sneezing but worse after eating. If unrevealing evaluation would have her see Dr. Loreta Ave her gastroenterologist and Dr. Algie Coffer her OB/GYN.  Anxiety with occasional  panic and history of same not interested in medication had been on in the remote past Discussed recommendations counseling medication as recommended and she can call to discuss more with me if needed. Obesity Is aware she needs to lose weight but not having success .

## 2011-08-19 NOTE — Patient Instructions (Addendum)
Unsure why you have the pain.  But urine is abnormal and could be a urinary tract infection. Contact you when culture is back you contact us at the end of the medicine  We will culture your urine and treat for such and if you're not getting better consider getting an ultrasound test and having Dr. Loreta Ave or [facility]  evaluate   Advise getting in with counseling in regard to the anxiety and come back and discuss if you're consider medication.

## 2011-08-23 ENCOUNTER — Ambulatory Visit (INDEPENDENT_AMBULATORY_CARE_PROVIDER_SITE_OTHER): Payer: BC Managed Care – PPO | Admitting: Internal Medicine

## 2011-08-23 ENCOUNTER — Encounter: Payer: Self-pay | Admitting: Internal Medicine

## 2011-08-23 VITALS — BP 138/72 | HR 103 | Temp 98.7°F | Wt 195.0 lb

## 2011-08-23 DIAGNOSIS — R5381 Other malaise: Secondary | ICD-10-CM

## 2011-08-23 DIAGNOSIS — R8290 Unspecified abnormal findings in urine: Secondary | ICD-10-CM

## 2011-08-23 DIAGNOSIS — R82998 Other abnormal findings in urine: Secondary | ICD-10-CM

## 2011-08-23 DIAGNOSIS — R5383 Other fatigue: Secondary | ICD-10-CM

## 2011-08-23 DIAGNOSIS — R109 Unspecified abdominal pain: Secondary | ICD-10-CM

## 2011-08-23 LAB — POCT URINALYSIS DIPSTICK
Bilirubin, UA: NEGATIVE
Blood, UA: 2
Glucose, UA: NEGATIVE
Spec Grav, UA: >=1.03

## 2011-08-23 NOTE — Progress Notes (Signed)
  Subjective:    Patient ID: Taylor Lee, female    DOB: Mar 12, 1950, 61 y.o.   MRN: [id number]  HPI  Patient comes in today for SDA for ongoing problem evaluation. Feels no better  after finishing antibiotic Bactrim for probable UTI. Still hurts affter eating. And all the time   No other meds  Except when she's been taking. She describes periumbilical helical pain after eating without any change in bowel habits but feels very bloated no vomiting diarrhea or constipation or blood in her stool. No flank pain. She feels achy all over and bad.  She still has concerns about Lyme disease and asks about followup serologies she had lab work done in the emergency room and [facility] after tick bite and was treated with doxycycline.  Review of Systems Negative for chest pain shortness of breath new rashes does have malaise fatigue no dysuria or gross hematuria  Past history family history social history reviewed in the electronic medical record. Head history of abdominal pelvic CT in the remote past when she had a UTI diagnosed. It apparently was normal.  No history of gallbladder disease. Outpatient Encounter Prescriptions as of 08/23/2011  Medication Sig Dispense Refill  . levothyroxine (SYNTHROID) 75 MCG tablet Take 1 tablet (75 mcg total) by mouth daily.  30 tablet  2  . sulfamethoxazole-trimethoprim (SEPTRA DS) 800-160 MG per tablet Take 1 tablet by mouth 2 (two) times daily.  10 tablet  0  . DISCONTD: HYDROcodone-acetaminophen (VICODIN) 5-500 MG per tablet Take 1 tablet by mouth every 6 (six) hours as needed.  30 tablet  0       Objective:   Physical Exam BP 138/72  Pulse 103  Temp 98.7 F (37.1 C) (Oral)  Wt 195 lb (88.451 kg)  SpO2 98% Well-developed well-nourished in no acute distress appears a bit tired nontoxic. Neck without adenopathy chest clear to auscultation cardiac S1-S2 pelvis murmurs Abdomen soft without obvious organomegaly points to right above the umbilicus as the  area of concern but no guarding or rebound no suprapubic pain no flank pain.  Negative CCE Skin normal turgor no acute rashes or bruising. Urinalysis repeat negative except for blood 2+. Culture shows multiple species cannot identify.     Assessment & Plan:   Postprandial abdominal pain with abnormal urinalysis unfortunately urine culture was not helpful but her urine looks improved from positive nitrites and leukocytes however blood remains. Her symptoms however don't sound GU related or renal stone seem more GI related.  Have her begin Prilosec once a day for possible gastritis get abdominal ultrasound as soon as possible look for gallstones etc. Unless her revealing diagnosis we'll plan GI referral to see Dr. Loreta Ave.  Regard to Lyme disease serology I don't have the emergency room report just serology reports that were negative for Lyme intake related disease ; tried to reassur her that her symptoms today do not are not consistent with Lyme and then she was treated after tick bite in case. She still but worried about this she said she was going to followup with [facility] people about this.  Told her I was more concerned about her abdominal situation.

## 2011-08-23 NOTE — Patient Instructions (Addendum)
Get a ultrasound.  Test.   Of abdomen .   This is a good check for   Gall stones.  In the meantime add prilosec each day for poss gastritis problem.  Repeat.  your urinalysis  Today .  I will review the chart  About the issue  Lyme disease test of blood.  is  Not a definitive test.  And the what you are having is not typical of this problem.

## 2011-08-24 ENCOUNTER — Ambulatory Visit
Admission: RE | Admit: 2011-08-24 | Discharge: 2011-08-24 | Disposition: A | Discharge status: Home / Self Care | Payer: BC Managed Care – PPO | Source: Ambulatory Visit | Attending: Internal Medicine | Admitting: Internal Medicine

## 2011-08-24 ENCOUNTER — Other Ambulatory Visit: Payer: Self-pay | Admitting: Family Medicine

## 2011-08-24 ENCOUNTER — Telehealth: Payer: Self-pay | Admitting: Family Medicine

## 2011-08-24 DIAGNOSIS — R109 Unspecified abdominal pain: Secondary | ICD-10-CM

## 2011-10-25 MED ORDER — LEVOTHYROXINE SODIUM 75 MCG PO TABS: 75.0000 ug | ORAL_TABLET | Freq: Every day | ORAL | Status: DC

## 2011-12-06 ENCOUNTER — Telehealth: Payer: Self-pay | Admitting: Family Medicine

## 2011-12-06 NOTE — Telephone Encounter (Signed)
Patient is requesting refills.  Last seen for fu on 03/12/11.  Has been seen acutely since then.  No fu scheduled.  Last filled on 08/23/11.  Please advise.  Thanks!!

## 2011-12-07 NOTE — Telephone Encounter (Signed)
Please get upudated hx of how her abd pain is doing  Can refill  Hydrocodone x 1 # 30

## 2011-12-08 ENCOUNTER — Other Ambulatory Visit: Payer: Self-pay | Admitting: Family Medicine

## 2011-12-08 MED ORDER — HYDROCODONE-ACETAMINOPHEN 5-500 MG PO TABS: 1.0000 | ORAL_TABLET | Freq: Four times a day (QID) | ORAL | Status: DC | PRN

## 2011-12-08 NOTE — Telephone Encounter (Signed)
Called to the pharmacy and left on voicemail. 

## 2011-12-08 NOTE — Telephone Encounter (Signed)
Left message on cell and home # for the pt to return my call.

## 2011-12-09 ENCOUNTER — Other Ambulatory Visit: Payer: Self-pay | Admitting: Internal Medicine

## 2011-12-09 MED ORDER — HYDROCODONE-ACETAMINOPHEN 5-300 MG PO TABS: 1.0000 | ORAL_TABLET | Freq: Four times a day (QID) | ORAL | Status: DC | PRN

## 2011-12-10 NOTE — Telephone Encounter (Signed)
Error

## 2011-12-23 ENCOUNTER — Other Ambulatory Visit: Payer: Self-pay | Admitting: Dermatology

## 2012-04-08 ENCOUNTER — Other Ambulatory Visit: Payer: Self-pay | Admitting: Internal Medicine

## 2012-04-10 ENCOUNTER — Other Ambulatory Visit: Payer: Self-pay | Admitting: Internal Medicine

## 2012-11-01 LAB — HM MAMMOGRAPHY

## 2012-11-06 ENCOUNTER — Encounter: Payer: Self-pay | Admitting: Internal Medicine

## 2013-08-09 ENCOUNTER — Other Ambulatory Visit: Payer: Self-pay | Admitting: Internal Medicine

## 2013-08-09 ENCOUNTER — Ambulatory Visit
Admission: RE | Admit: 2013-08-09 | Discharge: 2013-08-09 | Disposition: A | Discharge status: Home / Self Care | Payer: BC Managed Care – PPO | Source: Ambulatory Visit | Attending: Internal Medicine | Admitting: Internal Medicine

## 2013-08-09 DIAGNOSIS — M722 Plantar fascial fibromatosis: Secondary | ICD-10-CM

## 2013-10-25 ENCOUNTER — Ambulatory Visit: Payer: BC Managed Care – PPO | Attending: Internal Medicine | Admitting: Physical Therapy

## 2013-10-25 DIAGNOSIS — M25571 Pain in right ankle and joints of right foot: Secondary | ICD-10-CM | POA: Insufficient documentation

## 2013-11-01 ENCOUNTER — Ambulatory Visit: Payer: BC Managed Care – PPO | Admitting: Physical Therapy

## 2013-11-01 DIAGNOSIS — M25571 Pain in right ankle and joints of right foot: Secondary | ICD-10-CM | POA: Diagnosis not present

## 2013-11-02 ENCOUNTER — Ambulatory Visit: Payer: BC Managed Care – PPO | Admitting: Physical Therapy

## 2013-11-14 ENCOUNTER — Ambulatory Visit: Payer: BC Managed Care – PPO | Admitting: Physical Therapy

## 2013-11-14 DIAGNOSIS — M25571 Pain in right ankle and joints of right foot: Secondary | ICD-10-CM | POA: Diagnosis not present

## 2013-11-20 ENCOUNTER — Ambulatory Visit: Payer: BC Managed Care – PPO | Attending: Internal Medicine | Admitting: Physical Therapy

## 2013-11-20 DIAGNOSIS — M25571 Pain in right ankle and joints of right foot: Secondary | ICD-10-CM | POA: Insufficient documentation

## 2013-11-22 ENCOUNTER — Ambulatory Visit: Payer: BC Managed Care – PPO | Admitting: Physical Therapy

## 2013-11-22 DIAGNOSIS — M25571 Pain in right ankle and joints of right foot: Secondary | ICD-10-CM | POA: Diagnosis present

## 2013-11-30 ENCOUNTER — Ambulatory Visit: Payer: BC Managed Care – PPO | Admitting: Physical Therapy

## 2013-12-03 ENCOUNTER — Ambulatory Visit: Payer: BC Managed Care – PPO | Admitting: Physical Therapy

## 2013-12-03 DIAGNOSIS — M25571 Pain in right ankle and joints of right foot: Secondary | ICD-10-CM | POA: Diagnosis not present

## 2013-12-05 ENCOUNTER — Ambulatory Visit: Payer: BC Managed Care – PPO | Admitting: Physical Therapy

## 2013-12-05 DIAGNOSIS — M25571 Pain in right ankle and joints of right foot: Secondary | ICD-10-CM | POA: Diagnosis not present

## 2013-12-10 ENCOUNTER — Ambulatory Visit: Payer: BC Managed Care – PPO

## 2013-12-12 ENCOUNTER — Ambulatory Visit: Payer: BC Managed Care – PPO | Admitting: Physical Therapy

## 2013-12-17 LAB — HM MAMMOGRAPHY

## 2013-12-19 ENCOUNTER — Encounter: Payer: Self-pay | Admitting: Family Medicine

## 2013-12-26 ENCOUNTER — Ambulatory Visit: Payer: BC Managed Care – PPO | Attending: Internal Medicine | Admitting: Physical Therapy

## 2013-12-26 DIAGNOSIS — M25571 Pain in right ankle and joints of right foot: Secondary | ICD-10-CM | POA: Diagnosis present

## 2013-12-28 ENCOUNTER — Encounter: Payer: Self-pay | Admitting: Internal Medicine

## 2014-01-18 HISTORY — PX: UMBILICAL HERNIA REPAIR: SHX196

## 2014-09-02 ENCOUNTER — Other Ambulatory Visit: Payer: Self-pay | Admitting: Internal Medicine

## 2014-09-02 DIAGNOSIS — M5431 Sciatica, right side: Secondary | ICD-10-CM

## 2014-09-06 ENCOUNTER — Other Ambulatory Visit: Payer: Self-pay | Admitting: Family Medicine

## 2014-09-06 DIAGNOSIS — M5431 Sciatica, right side: Secondary | ICD-10-CM

## 2014-09-12 ENCOUNTER — Other Ambulatory Visit: Payer: Self-pay

## 2014-09-15 ENCOUNTER — Other Ambulatory Visit: Payer: Self-pay

## 2015-12-25 DIAGNOSIS — Z1231 Encounter for screening mammogram for malignant neoplasm of breast: Secondary | ICD-10-CM | POA: Diagnosis not present

## 2016-02-02 DIAGNOSIS — M9903 Segmental and somatic dysfunction of lumbar region: Secondary | ICD-10-CM | POA: Diagnosis not present

## 2016-02-02 DIAGNOSIS — M9905 Segmental and somatic dysfunction of pelvic region: Secondary | ICD-10-CM | POA: Diagnosis not present

## 2016-02-02 DIAGNOSIS — M9904 Segmental and somatic dysfunction of sacral region: Secondary | ICD-10-CM | POA: Diagnosis not present

## 2016-02-02 DIAGNOSIS — M545 Low back pain: Secondary | ICD-10-CM | POA: Diagnosis not present

## 2016-02-11 DIAGNOSIS — M9903 Segmental and somatic dysfunction of lumbar region: Secondary | ICD-10-CM | POA: Diagnosis not present

## 2016-02-11 DIAGNOSIS — M9905 Segmental and somatic dysfunction of pelvic region: Secondary | ICD-10-CM | POA: Diagnosis not present

## 2016-02-11 DIAGNOSIS — M545 Low back pain: Secondary | ICD-10-CM | POA: Diagnosis not present

## 2016-02-11 DIAGNOSIS — M9904 Segmental and somatic dysfunction of sacral region: Secondary | ICD-10-CM | POA: Diagnosis not present

## 2016-02-19 DIAGNOSIS — M9904 Segmental and somatic dysfunction of sacral region: Secondary | ICD-10-CM | POA: Diagnosis not present

## 2016-02-19 DIAGNOSIS — M545 Low back pain: Secondary | ICD-10-CM | POA: Diagnosis not present

## 2016-02-19 DIAGNOSIS — M9905 Segmental and somatic dysfunction of pelvic region: Secondary | ICD-10-CM | POA: Diagnosis not present

## 2016-02-19 DIAGNOSIS — M9903 Segmental and somatic dysfunction of lumbar region: Secondary | ICD-10-CM | POA: Diagnosis not present

## 2016-02-24 DIAGNOSIS — R69 Illness, unspecified: Secondary | ICD-10-CM | POA: Diagnosis not present

## 2016-02-26 DIAGNOSIS — M9904 Segmental and somatic dysfunction of sacral region: Secondary | ICD-10-CM | POA: Diagnosis not present

## 2016-02-26 DIAGNOSIS — M9905 Segmental and somatic dysfunction of pelvic region: Secondary | ICD-10-CM | POA: Diagnosis not present

## 2016-02-26 DIAGNOSIS — M545 Low back pain: Secondary | ICD-10-CM | POA: Diagnosis not present

## 2016-02-26 DIAGNOSIS — M9903 Segmental and somatic dysfunction of lumbar region: Secondary | ICD-10-CM | POA: Diagnosis not present

## 2016-03-02 DIAGNOSIS — M545 Low back pain: Secondary | ICD-10-CM | POA: Diagnosis not present

## 2016-03-02 DIAGNOSIS — M9904 Segmental and somatic dysfunction of sacral region: Secondary | ICD-10-CM | POA: Diagnosis not present

## 2016-03-02 DIAGNOSIS — M9903 Segmental and somatic dysfunction of lumbar region: Secondary | ICD-10-CM | POA: Diagnosis not present

## 2016-03-02 DIAGNOSIS — M9905 Segmental and somatic dysfunction of pelvic region: Secondary | ICD-10-CM | POA: Diagnosis not present

## 2016-03-04 DIAGNOSIS — M545 Low back pain: Secondary | ICD-10-CM | POA: Diagnosis not present

## 2016-03-04 DIAGNOSIS — M9905 Segmental and somatic dysfunction of pelvic region: Secondary | ICD-10-CM | POA: Diagnosis not present

## 2016-03-04 DIAGNOSIS — M9903 Segmental and somatic dysfunction of lumbar region: Secondary | ICD-10-CM | POA: Diagnosis not present

## 2016-03-04 DIAGNOSIS — M9904 Segmental and somatic dysfunction of sacral region: Secondary | ICD-10-CM | POA: Diagnosis not present

## 2016-03-10 DIAGNOSIS — M545 Low back pain: Secondary | ICD-10-CM | POA: Diagnosis not present

## 2016-03-10 DIAGNOSIS — M9905 Segmental and somatic dysfunction of pelvic region: Secondary | ICD-10-CM | POA: Diagnosis not present

## 2016-03-10 DIAGNOSIS — M9903 Segmental and somatic dysfunction of lumbar region: Secondary | ICD-10-CM | POA: Diagnosis not present

## 2016-03-10 DIAGNOSIS — M9904 Segmental and somatic dysfunction of sacral region: Secondary | ICD-10-CM | POA: Diagnosis not present

## 2016-03-17 DIAGNOSIS — M9905 Segmental and somatic dysfunction of pelvic region: Secondary | ICD-10-CM | POA: Diagnosis not present

## 2016-03-17 DIAGNOSIS — M9903 Segmental and somatic dysfunction of lumbar region: Secondary | ICD-10-CM | POA: Diagnosis not present

## 2016-03-17 DIAGNOSIS — M545 Low back pain: Secondary | ICD-10-CM | POA: Diagnosis not present

## 2016-03-17 DIAGNOSIS — M9904 Segmental and somatic dysfunction of sacral region: Secondary | ICD-10-CM | POA: Diagnosis not present

## 2016-03-24 DIAGNOSIS — M9903 Segmental and somatic dysfunction of lumbar region: Secondary | ICD-10-CM | POA: Diagnosis not present

## 2016-03-24 DIAGNOSIS — M9904 Segmental and somatic dysfunction of sacral region: Secondary | ICD-10-CM | POA: Diagnosis not present

## 2016-03-24 DIAGNOSIS — M545 Low back pain: Secondary | ICD-10-CM | POA: Diagnosis not present

## 2016-03-24 DIAGNOSIS — M9905 Segmental and somatic dysfunction of pelvic region: Secondary | ICD-10-CM | POA: Diagnosis not present

## 2016-03-31 DIAGNOSIS — M9903 Segmental and somatic dysfunction of lumbar region: Secondary | ICD-10-CM | POA: Diagnosis not present

## 2016-03-31 DIAGNOSIS — M545 Low back pain: Secondary | ICD-10-CM | POA: Diagnosis not present

## 2016-03-31 DIAGNOSIS — M9904 Segmental and somatic dysfunction of sacral region: Secondary | ICD-10-CM | POA: Diagnosis not present

## 2016-03-31 DIAGNOSIS — M9905 Segmental and somatic dysfunction of pelvic region: Secondary | ICD-10-CM | POA: Diagnosis not present

## 2016-04-07 DIAGNOSIS — M9904 Segmental and somatic dysfunction of sacral region: Secondary | ICD-10-CM | POA: Diagnosis not present

## 2016-04-07 DIAGNOSIS — M9905 Segmental and somatic dysfunction of pelvic region: Secondary | ICD-10-CM | POA: Diagnosis not present

## 2016-04-07 DIAGNOSIS — M545 Low back pain: Secondary | ICD-10-CM | POA: Diagnosis not present

## 2016-04-07 DIAGNOSIS — M9903 Segmental and somatic dysfunction of lumbar region: Secondary | ICD-10-CM | POA: Diagnosis not present

## 2016-04-14 DIAGNOSIS — M545 Low back pain: Secondary | ICD-10-CM | POA: Diagnosis not present

## 2016-04-14 DIAGNOSIS — M9904 Segmental and somatic dysfunction of sacral region: Secondary | ICD-10-CM | POA: Diagnosis not present

## 2016-04-14 DIAGNOSIS — M9903 Segmental and somatic dysfunction of lumbar region: Secondary | ICD-10-CM | POA: Diagnosis not present

## 2016-04-14 DIAGNOSIS — M9905 Segmental and somatic dysfunction of pelvic region: Secondary | ICD-10-CM | POA: Diagnosis not present

## 2016-04-21 DIAGNOSIS — M9904 Segmental and somatic dysfunction of sacral region: Secondary | ICD-10-CM | POA: Diagnosis not present

## 2016-04-21 DIAGNOSIS — M9903 Segmental and somatic dysfunction of lumbar region: Secondary | ICD-10-CM | POA: Diagnosis not present

## 2016-04-21 DIAGNOSIS — M545 Low back pain: Secondary | ICD-10-CM | POA: Diagnosis not present

## 2016-04-21 DIAGNOSIS — M9905 Segmental and somatic dysfunction of pelvic region: Secondary | ICD-10-CM | POA: Diagnosis not present

## 2016-04-22 DIAGNOSIS — M9903 Segmental and somatic dysfunction of lumbar region: Secondary | ICD-10-CM | POA: Diagnosis not present

## 2016-04-22 DIAGNOSIS — M542 Cervicalgia: Secondary | ICD-10-CM | POA: Diagnosis not present

## 2016-04-22 DIAGNOSIS — M9901 Segmental and somatic dysfunction of cervical region: Secondary | ICD-10-CM | POA: Diagnosis not present

## 2016-04-22 DIAGNOSIS — M5442 Lumbago with sciatica, left side: Secondary | ICD-10-CM | POA: Diagnosis not present

## 2016-04-27 DIAGNOSIS — M9903 Segmental and somatic dysfunction of lumbar region: Secondary | ICD-10-CM | POA: Diagnosis not present

## 2016-04-27 DIAGNOSIS — M5442 Lumbago with sciatica, left side: Secondary | ICD-10-CM | POA: Diagnosis not present

## 2016-04-27 DIAGNOSIS — M9901 Segmental and somatic dysfunction of cervical region: Secondary | ICD-10-CM | POA: Diagnosis not present

## 2016-04-27 DIAGNOSIS — M542 Cervicalgia: Secondary | ICD-10-CM | POA: Diagnosis not present

## 2016-04-29 DIAGNOSIS — M9903 Segmental and somatic dysfunction of lumbar region: Secondary | ICD-10-CM | POA: Diagnosis not present

## 2016-04-29 DIAGNOSIS — M542 Cervicalgia: Secondary | ICD-10-CM | POA: Diagnosis not present

## 2016-04-29 DIAGNOSIS — M9901 Segmental and somatic dysfunction of cervical region: Secondary | ICD-10-CM | POA: Diagnosis not present

## 2016-04-29 DIAGNOSIS — M5442 Lumbago with sciatica, left side: Secondary | ICD-10-CM | POA: Diagnosis not present

## 2016-05-04 DIAGNOSIS — M9903 Segmental and somatic dysfunction of lumbar region: Secondary | ICD-10-CM | POA: Diagnosis not present

## 2016-05-04 DIAGNOSIS — M5442 Lumbago with sciatica, left side: Secondary | ICD-10-CM | POA: Diagnosis not present

## 2016-05-04 DIAGNOSIS — M9901 Segmental and somatic dysfunction of cervical region: Secondary | ICD-10-CM | POA: Diagnosis not present

## 2016-05-04 DIAGNOSIS — M542 Cervicalgia: Secondary | ICD-10-CM | POA: Diagnosis not present

## 2016-05-06 DIAGNOSIS — M5442 Lumbago with sciatica, left side: Secondary | ICD-10-CM | POA: Diagnosis not present

## 2016-05-06 DIAGNOSIS — M9903 Segmental and somatic dysfunction of lumbar region: Secondary | ICD-10-CM | POA: Diagnosis not present

## 2016-05-06 DIAGNOSIS — M9901 Segmental and somatic dysfunction of cervical region: Secondary | ICD-10-CM | POA: Diagnosis not present

## 2016-05-06 DIAGNOSIS — M542 Cervicalgia: Secondary | ICD-10-CM | POA: Diagnosis not present

## 2016-05-10 DIAGNOSIS — M542 Cervicalgia: Secondary | ICD-10-CM | POA: Diagnosis not present

## 2016-05-10 DIAGNOSIS — M5442 Lumbago with sciatica, left side: Secondary | ICD-10-CM | POA: Diagnosis not present

## 2016-05-10 DIAGNOSIS — M9903 Segmental and somatic dysfunction of lumbar region: Secondary | ICD-10-CM | POA: Diagnosis not present

## 2016-05-10 DIAGNOSIS — M9901 Segmental and somatic dysfunction of cervical region: Secondary | ICD-10-CM | POA: Diagnosis not present

## 2016-05-11 DIAGNOSIS — M9903 Segmental and somatic dysfunction of lumbar region: Secondary | ICD-10-CM | POA: Diagnosis not present

## 2016-05-11 DIAGNOSIS — M9901 Segmental and somatic dysfunction of cervical region: Secondary | ICD-10-CM | POA: Diagnosis not present

## 2016-05-11 DIAGNOSIS — M5442 Lumbago with sciatica, left side: Secondary | ICD-10-CM | POA: Diagnosis not present

## 2016-05-11 DIAGNOSIS — M542 Cervicalgia: Secondary | ICD-10-CM | POA: Diagnosis not present

## 2016-05-12 DIAGNOSIS — M5442 Lumbago with sciatica, left side: Secondary | ICD-10-CM | POA: Diagnosis not present

## 2016-05-12 DIAGNOSIS — M9903 Segmental and somatic dysfunction of lumbar region: Secondary | ICD-10-CM | POA: Diagnosis not present

## 2016-05-12 DIAGNOSIS — M542 Cervicalgia: Secondary | ICD-10-CM | POA: Diagnosis not present

## 2016-05-12 DIAGNOSIS — M9901 Segmental and somatic dysfunction of cervical region: Secondary | ICD-10-CM | POA: Diagnosis not present

## 2016-05-17 DIAGNOSIS — M542 Cervicalgia: Secondary | ICD-10-CM | POA: Diagnosis not present

## 2016-05-17 DIAGNOSIS — M5442 Lumbago with sciatica, left side: Secondary | ICD-10-CM | POA: Diagnosis not present

## 2016-05-17 DIAGNOSIS — M9901 Segmental and somatic dysfunction of cervical region: Secondary | ICD-10-CM | POA: Diagnosis not present

## 2016-05-17 DIAGNOSIS — M9903 Segmental and somatic dysfunction of lumbar region: Secondary | ICD-10-CM | POA: Diagnosis not present

## 2016-05-18 DIAGNOSIS — M9901 Segmental and somatic dysfunction of cervical region: Secondary | ICD-10-CM | POA: Diagnosis not present

## 2016-05-18 DIAGNOSIS — M5442 Lumbago with sciatica, left side: Secondary | ICD-10-CM | POA: Diagnosis not present

## 2016-05-18 DIAGNOSIS — M542 Cervicalgia: Secondary | ICD-10-CM | POA: Diagnosis not present

## 2016-05-18 DIAGNOSIS — M9903 Segmental and somatic dysfunction of lumbar region: Secondary | ICD-10-CM | POA: Diagnosis not present

## 2016-05-20 DIAGNOSIS — M9901 Segmental and somatic dysfunction of cervical region: Secondary | ICD-10-CM | POA: Diagnosis not present

## 2016-05-20 DIAGNOSIS — M542 Cervicalgia: Secondary | ICD-10-CM | POA: Diagnosis not present

## 2016-05-20 DIAGNOSIS — M9903 Segmental and somatic dysfunction of lumbar region: Secondary | ICD-10-CM | POA: Diagnosis not present

## 2016-05-20 DIAGNOSIS — M5442 Lumbago with sciatica, left side: Secondary | ICD-10-CM | POA: Diagnosis not present

## 2016-05-24 DIAGNOSIS — E039 Hypothyroidism, unspecified: Secondary | ICD-10-CM | POA: Diagnosis not present

## 2016-05-24 DIAGNOSIS — M542 Cervicalgia: Secondary | ICD-10-CM | POA: Diagnosis not present

## 2016-05-24 DIAGNOSIS — M9903 Segmental and somatic dysfunction of lumbar region: Secondary | ICD-10-CM | POA: Diagnosis not present

## 2016-05-24 DIAGNOSIS — M9901 Segmental and somatic dysfunction of cervical region: Secondary | ICD-10-CM | POA: Diagnosis not present

## 2016-05-24 DIAGNOSIS — R7303 Prediabetes: Secondary | ICD-10-CM | POA: Diagnosis not present

## 2016-05-24 DIAGNOSIS — M5416 Radiculopathy, lumbar region: Secondary | ICD-10-CM | POA: Diagnosis not present

## 2016-05-24 DIAGNOSIS — M5442 Lumbago with sciatica, left side: Secondary | ICD-10-CM | POA: Diagnosis not present

## 2016-06-03 DIAGNOSIS — M542 Cervicalgia: Secondary | ICD-10-CM | POA: Diagnosis not present

## 2016-06-03 DIAGNOSIS — M9903 Segmental and somatic dysfunction of lumbar region: Secondary | ICD-10-CM | POA: Diagnosis not present

## 2016-06-03 DIAGNOSIS — M5442 Lumbago with sciatica, left side: Secondary | ICD-10-CM | POA: Diagnosis not present

## 2016-06-03 DIAGNOSIS — M9901 Segmental and somatic dysfunction of cervical region: Secondary | ICD-10-CM | POA: Diagnosis not present

## 2016-06-07 DIAGNOSIS — M9903 Segmental and somatic dysfunction of lumbar region: Secondary | ICD-10-CM | POA: Diagnosis not present

## 2016-06-07 DIAGNOSIS — M9901 Segmental and somatic dysfunction of cervical region: Secondary | ICD-10-CM | POA: Diagnosis not present

## 2016-06-07 DIAGNOSIS — M5442 Lumbago with sciatica, left side: Secondary | ICD-10-CM | POA: Diagnosis not present

## 2016-06-07 DIAGNOSIS — M542 Cervicalgia: Secondary | ICD-10-CM | POA: Diagnosis not present

## 2016-06-09 DIAGNOSIS — M5442 Lumbago with sciatica, left side: Secondary | ICD-10-CM | POA: Diagnosis not present

## 2016-06-09 DIAGNOSIS — M542 Cervicalgia: Secondary | ICD-10-CM | POA: Diagnosis not present

## 2016-06-09 DIAGNOSIS — M9903 Segmental and somatic dysfunction of lumbar region: Secondary | ICD-10-CM | POA: Diagnosis not present

## 2016-06-09 DIAGNOSIS — M9901 Segmental and somatic dysfunction of cervical region: Secondary | ICD-10-CM | POA: Diagnosis not present

## 2016-06-15 DIAGNOSIS — H16223 Keratoconjunctivitis sicca, not specified as Sjogren's, bilateral: Secondary | ICD-10-CM | POA: Diagnosis not present

## 2016-06-15 DIAGNOSIS — H524 Presbyopia: Secondary | ICD-10-CM | POA: Diagnosis not present

## 2016-06-15 DIAGNOSIS — M9901 Segmental and somatic dysfunction of cervical region: Secondary | ICD-10-CM | POA: Diagnosis not present

## 2016-06-15 DIAGNOSIS — M5442 Lumbago with sciatica, left side: Secondary | ICD-10-CM | POA: Diagnosis not present

## 2016-06-15 DIAGNOSIS — H40023 Open angle with borderline findings, high risk, bilateral: Secondary | ICD-10-CM | POA: Diagnosis not present

## 2016-06-15 DIAGNOSIS — M542 Cervicalgia: Secondary | ICD-10-CM | POA: Diagnosis not present

## 2016-06-15 DIAGNOSIS — M9903 Segmental and somatic dysfunction of lumbar region: Secondary | ICD-10-CM | POA: Diagnosis not present

## 2016-06-15 DIAGNOSIS — H2513 Age-related nuclear cataract, bilateral: Secondary | ICD-10-CM | POA: Diagnosis not present

## 2016-06-15 DIAGNOSIS — H52203 Unspecified astigmatism, bilateral: Secondary | ICD-10-CM | POA: Diagnosis not present

## 2016-06-15 DIAGNOSIS — E119 Type 2 diabetes mellitus without complications: Secondary | ICD-10-CM | POA: Diagnosis not present

## 2016-06-15 DIAGNOSIS — H43393 Other vitreous opacities, bilateral: Secondary | ICD-10-CM | POA: Diagnosis not present

## 2016-06-17 DIAGNOSIS — M9901 Segmental and somatic dysfunction of cervical region: Secondary | ICD-10-CM | POA: Diagnosis not present

## 2016-06-17 DIAGNOSIS — M5442 Lumbago with sciatica, left side: Secondary | ICD-10-CM | POA: Diagnosis not present

## 2016-06-17 DIAGNOSIS — M542 Cervicalgia: Secondary | ICD-10-CM | POA: Diagnosis not present

## 2016-06-17 DIAGNOSIS — M9903 Segmental and somatic dysfunction of lumbar region: Secondary | ICD-10-CM | POA: Diagnosis not present

## 2016-06-21 DIAGNOSIS — M9901 Segmental and somatic dysfunction of cervical region: Secondary | ICD-10-CM | POA: Diagnosis not present

## 2016-06-21 DIAGNOSIS — M9903 Segmental and somatic dysfunction of lumbar region: Secondary | ICD-10-CM | POA: Diagnosis not present

## 2016-06-21 DIAGNOSIS — M542 Cervicalgia: Secondary | ICD-10-CM | POA: Diagnosis not present

## 2016-06-21 DIAGNOSIS — M5442 Lumbago with sciatica, left side: Secondary | ICD-10-CM | POA: Diagnosis not present

## 2016-07-01 DIAGNOSIS — M9903 Segmental and somatic dysfunction of lumbar region: Secondary | ICD-10-CM | POA: Diagnosis not present

## 2016-07-01 DIAGNOSIS — M542 Cervicalgia: Secondary | ICD-10-CM | POA: Diagnosis not present

## 2016-07-01 DIAGNOSIS — M9901 Segmental and somatic dysfunction of cervical region: Secondary | ICD-10-CM | POA: Diagnosis not present

## 2016-07-01 DIAGNOSIS — M5442 Lumbago with sciatica, left side: Secondary | ICD-10-CM | POA: Diagnosis not present

## 2016-07-07 DIAGNOSIS — M542 Cervicalgia: Secondary | ICD-10-CM | POA: Diagnosis not present

## 2016-07-07 DIAGNOSIS — M9903 Segmental and somatic dysfunction of lumbar region: Secondary | ICD-10-CM | POA: Diagnosis not present

## 2016-07-07 DIAGNOSIS — M9901 Segmental and somatic dysfunction of cervical region: Secondary | ICD-10-CM | POA: Diagnosis not present

## 2016-07-07 DIAGNOSIS — M5442 Lumbago with sciatica, left side: Secondary | ICD-10-CM | POA: Diagnosis not present

## 2016-07-13 DIAGNOSIS — M5442 Lumbago with sciatica, left side: Secondary | ICD-10-CM | POA: Diagnosis not present

## 2016-07-13 DIAGNOSIS — M9901 Segmental and somatic dysfunction of cervical region: Secondary | ICD-10-CM | POA: Diagnosis not present

## 2016-07-13 DIAGNOSIS — M9903 Segmental and somatic dysfunction of lumbar region: Secondary | ICD-10-CM | POA: Diagnosis not present

## 2016-07-13 DIAGNOSIS — M542 Cervicalgia: Secondary | ICD-10-CM | POA: Diagnosis not present

## 2016-07-19 DIAGNOSIS — M9901 Segmental and somatic dysfunction of cervical region: Secondary | ICD-10-CM | POA: Diagnosis not present

## 2016-07-19 DIAGNOSIS — M542 Cervicalgia: Secondary | ICD-10-CM | POA: Diagnosis not present

## 2016-07-19 DIAGNOSIS — M5442 Lumbago with sciatica, left side: Secondary | ICD-10-CM | POA: Diagnosis not present

## 2016-07-19 DIAGNOSIS — M9903 Segmental and somatic dysfunction of lumbar region: Secondary | ICD-10-CM | POA: Diagnosis not present

## 2016-08-02 DIAGNOSIS — M542 Cervicalgia: Secondary | ICD-10-CM | POA: Diagnosis not present

## 2016-08-02 DIAGNOSIS — M9901 Segmental and somatic dysfunction of cervical region: Secondary | ICD-10-CM | POA: Diagnosis not present

## 2016-08-02 DIAGNOSIS — M5442 Lumbago with sciatica, left side: Secondary | ICD-10-CM | POA: Diagnosis not present

## 2016-08-02 DIAGNOSIS — M9903 Segmental and somatic dysfunction of lumbar region: Secondary | ICD-10-CM | POA: Diagnosis not present

## 2016-08-05 DIAGNOSIS — M542 Cervicalgia: Secondary | ICD-10-CM | POA: Diagnosis not present

## 2016-08-05 DIAGNOSIS — M9901 Segmental and somatic dysfunction of cervical region: Secondary | ICD-10-CM | POA: Diagnosis not present

## 2016-08-05 DIAGNOSIS — M9903 Segmental and somatic dysfunction of lumbar region: Secondary | ICD-10-CM | POA: Diagnosis not present

## 2016-08-05 DIAGNOSIS — M5442 Lumbago with sciatica, left side: Secondary | ICD-10-CM | POA: Diagnosis not present

## 2016-08-09 DIAGNOSIS — M542 Cervicalgia: Secondary | ICD-10-CM | POA: Diagnosis not present

## 2016-08-09 DIAGNOSIS — M9903 Segmental and somatic dysfunction of lumbar region: Secondary | ICD-10-CM | POA: Diagnosis not present

## 2016-08-09 DIAGNOSIS — M5442 Lumbago with sciatica, left side: Secondary | ICD-10-CM | POA: Diagnosis not present

## 2016-08-09 DIAGNOSIS — M9901 Segmental and somatic dysfunction of cervical region: Secondary | ICD-10-CM | POA: Diagnosis not present

## 2016-08-10 DIAGNOSIS — M9901 Segmental and somatic dysfunction of cervical region: Secondary | ICD-10-CM | POA: Diagnosis not present

## 2016-08-10 DIAGNOSIS — M542 Cervicalgia: Secondary | ICD-10-CM | POA: Diagnosis not present

## 2016-08-10 DIAGNOSIS — M5442 Lumbago with sciatica, left side: Secondary | ICD-10-CM | POA: Diagnosis not present

## 2016-08-10 DIAGNOSIS — M9903 Segmental and somatic dysfunction of lumbar region: Secondary | ICD-10-CM | POA: Diagnosis not present

## 2016-08-11 DIAGNOSIS — M9901 Segmental and somatic dysfunction of cervical region: Secondary | ICD-10-CM | POA: Diagnosis not present

## 2016-08-11 DIAGNOSIS — M9903 Segmental and somatic dysfunction of lumbar region: Secondary | ICD-10-CM | POA: Diagnosis not present

## 2016-08-11 DIAGNOSIS — M542 Cervicalgia: Secondary | ICD-10-CM | POA: Diagnosis not present

## 2016-08-11 DIAGNOSIS — M5442 Lumbago with sciatica, left side: Secondary | ICD-10-CM | POA: Diagnosis not present

## 2016-08-13 DIAGNOSIS — M542 Cervicalgia: Secondary | ICD-10-CM | POA: Diagnosis not present

## 2016-08-13 DIAGNOSIS — M9901 Segmental and somatic dysfunction of cervical region: Secondary | ICD-10-CM | POA: Diagnosis not present

## 2016-08-13 DIAGNOSIS — M9903 Segmental and somatic dysfunction of lumbar region: Secondary | ICD-10-CM | POA: Diagnosis not present

## 2016-08-13 DIAGNOSIS — M5442 Lumbago with sciatica, left side: Secondary | ICD-10-CM | POA: Diagnosis not present

## 2016-08-16 DIAGNOSIS — M9903 Segmental and somatic dysfunction of lumbar region: Secondary | ICD-10-CM | POA: Diagnosis not present

## 2016-08-16 DIAGNOSIS — M545 Low back pain: Secondary | ICD-10-CM | POA: Diagnosis not present

## 2016-08-16 DIAGNOSIS — M546 Pain in thoracic spine: Secondary | ICD-10-CM | POA: Diagnosis not present

## 2016-08-16 DIAGNOSIS — M9902 Segmental and somatic dysfunction of thoracic region: Secondary | ICD-10-CM | POA: Diagnosis not present

## 2016-08-18 DIAGNOSIS — M546 Pain in thoracic spine: Secondary | ICD-10-CM | POA: Diagnosis not present

## 2016-08-18 DIAGNOSIS — M545 Low back pain: Secondary | ICD-10-CM | POA: Diagnosis not present

## 2016-08-18 DIAGNOSIS — M9903 Segmental and somatic dysfunction of lumbar region: Secondary | ICD-10-CM | POA: Diagnosis not present

## 2016-08-18 DIAGNOSIS — M9902 Segmental and somatic dysfunction of thoracic region: Secondary | ICD-10-CM | POA: Diagnosis not present

## 2016-08-23 DIAGNOSIS — M546 Pain in thoracic spine: Secondary | ICD-10-CM | POA: Diagnosis not present

## 2016-08-23 DIAGNOSIS — M9902 Segmental and somatic dysfunction of thoracic region: Secondary | ICD-10-CM | POA: Diagnosis not present

## 2016-08-23 DIAGNOSIS — M545 Low back pain: Secondary | ICD-10-CM | POA: Diagnosis not present

## 2016-08-23 DIAGNOSIS — M9903 Segmental and somatic dysfunction of lumbar region: Secondary | ICD-10-CM | POA: Diagnosis not present

## 2016-08-25 DIAGNOSIS — M9902 Segmental and somatic dysfunction of thoracic region: Secondary | ICD-10-CM | POA: Diagnosis not present

## 2016-08-25 DIAGNOSIS — M545 Low back pain: Secondary | ICD-10-CM | POA: Diagnosis not present

## 2016-08-25 DIAGNOSIS — M546 Pain in thoracic spine: Secondary | ICD-10-CM | POA: Diagnosis not present

## 2016-08-25 DIAGNOSIS — M9903 Segmental and somatic dysfunction of lumbar region: Secondary | ICD-10-CM | POA: Diagnosis not present

## 2016-08-27 DIAGNOSIS — E279 Disorder of adrenal gland, unspecified: Secondary | ICD-10-CM | POA: Diagnosis not present

## 2016-08-27 DIAGNOSIS — M9903 Segmental and somatic dysfunction of lumbar region: Secondary | ICD-10-CM | POA: Diagnosis not present

## 2016-08-27 DIAGNOSIS — E7800 Pure hypercholesterolemia, unspecified: Secondary | ICD-10-CM | POA: Diagnosis not present

## 2016-08-27 DIAGNOSIS — E039 Hypothyroidism, unspecified: Secondary | ICD-10-CM | POA: Diagnosis not present

## 2016-08-27 DIAGNOSIS — N951 Menopausal and female climacteric states: Secondary | ICD-10-CM | POA: Diagnosis not present

## 2016-08-27 DIAGNOSIS — M546 Pain in thoracic spine: Secondary | ICD-10-CM | POA: Diagnosis not present

## 2016-08-27 DIAGNOSIS — R5383 Other fatigue: Secondary | ICD-10-CM | POA: Diagnosis not present

## 2016-08-27 DIAGNOSIS — M545 Low back pain: Secondary | ICD-10-CM | POA: Diagnosis not present

## 2016-08-27 DIAGNOSIS — R5381 Other malaise: Secondary | ICD-10-CM | POA: Diagnosis not present

## 2016-08-27 DIAGNOSIS — E509 Vitamin A deficiency, unspecified: Secondary | ICD-10-CM | POA: Diagnosis not present

## 2016-08-27 DIAGNOSIS — E7210 Disorders of sulfur-bearing amino-acid metabolism, unspecified: Secondary | ICD-10-CM | POA: Diagnosis not present

## 2016-08-27 DIAGNOSIS — M9902 Segmental and somatic dysfunction of thoracic region: Secondary | ICD-10-CM | POA: Diagnosis not present

## 2016-08-31 DIAGNOSIS — M545 Low back pain: Secondary | ICD-10-CM | POA: Diagnosis not present

## 2016-08-31 DIAGNOSIS — M546 Pain in thoracic spine: Secondary | ICD-10-CM | POA: Diagnosis not present

## 2016-08-31 DIAGNOSIS — M9903 Segmental and somatic dysfunction of lumbar region: Secondary | ICD-10-CM | POA: Diagnosis not present

## 2016-08-31 DIAGNOSIS — M9902 Segmental and somatic dysfunction of thoracic region: Secondary | ICD-10-CM | POA: Diagnosis not present

## 2016-09-06 DIAGNOSIS — M9902 Segmental and somatic dysfunction of thoracic region: Secondary | ICD-10-CM | POA: Diagnosis not present

## 2016-09-06 DIAGNOSIS — M9903 Segmental and somatic dysfunction of lumbar region: Secondary | ICD-10-CM | POA: Diagnosis not present

## 2016-09-06 DIAGNOSIS — M546 Pain in thoracic spine: Secondary | ICD-10-CM | POA: Diagnosis not present

## 2016-09-06 DIAGNOSIS — M545 Low back pain: Secondary | ICD-10-CM | POA: Diagnosis not present

## 2016-09-10 DIAGNOSIS — M546 Pain in thoracic spine: Secondary | ICD-10-CM | POA: Diagnosis not present

## 2016-09-10 DIAGNOSIS — M545 Low back pain: Secondary | ICD-10-CM | POA: Diagnosis not present

## 2016-09-10 DIAGNOSIS — M9902 Segmental and somatic dysfunction of thoracic region: Secondary | ICD-10-CM | POA: Diagnosis not present

## 2016-09-10 DIAGNOSIS — M9903 Segmental and somatic dysfunction of lumbar region: Secondary | ICD-10-CM | POA: Diagnosis not present

## 2016-09-13 DIAGNOSIS — M545 Low back pain: Secondary | ICD-10-CM | POA: Diagnosis not present

## 2016-09-13 DIAGNOSIS — M546 Pain in thoracic spine: Secondary | ICD-10-CM | POA: Diagnosis not present

## 2016-09-13 DIAGNOSIS — M9902 Segmental and somatic dysfunction of thoracic region: Secondary | ICD-10-CM | POA: Diagnosis not present

## 2016-09-13 DIAGNOSIS — M9903 Segmental and somatic dysfunction of lumbar region: Secondary | ICD-10-CM | POA: Diagnosis not present

## 2016-09-16 DIAGNOSIS — M545 Low back pain: Secondary | ICD-10-CM | POA: Diagnosis not present

## 2016-09-16 DIAGNOSIS — M546 Pain in thoracic spine: Secondary | ICD-10-CM | POA: Diagnosis not present

## 2016-09-16 DIAGNOSIS — M9902 Segmental and somatic dysfunction of thoracic region: Secondary | ICD-10-CM | POA: Diagnosis not present

## 2016-09-16 DIAGNOSIS — M9903 Segmental and somatic dysfunction of lumbar region: Secondary | ICD-10-CM | POA: Diagnosis not present

## 2016-09-17 DIAGNOSIS — M545 Low back pain: Secondary | ICD-10-CM | POA: Diagnosis not present

## 2016-09-17 DIAGNOSIS — M9903 Segmental and somatic dysfunction of lumbar region: Secondary | ICD-10-CM | POA: Diagnosis not present

## 2016-09-17 DIAGNOSIS — M9902 Segmental and somatic dysfunction of thoracic region: Secondary | ICD-10-CM | POA: Diagnosis not present

## 2016-09-17 DIAGNOSIS — M546 Pain in thoracic spine: Secondary | ICD-10-CM | POA: Diagnosis not present

## 2016-10-06 DIAGNOSIS — M9902 Segmental and somatic dysfunction of thoracic region: Secondary | ICD-10-CM | POA: Diagnosis not present

## 2016-10-06 DIAGNOSIS — M545 Low back pain: Secondary | ICD-10-CM | POA: Diagnosis not present

## 2016-10-06 DIAGNOSIS — M9903 Segmental and somatic dysfunction of lumbar region: Secondary | ICD-10-CM | POA: Diagnosis not present

## 2016-10-06 DIAGNOSIS — M546 Pain in thoracic spine: Secondary | ICD-10-CM | POA: Diagnosis not present

## 2016-10-20 DIAGNOSIS — M545 Low back pain: Secondary | ICD-10-CM | POA: Diagnosis not present

## 2016-10-20 DIAGNOSIS — M9903 Segmental and somatic dysfunction of lumbar region: Secondary | ICD-10-CM | POA: Diagnosis not present

## 2016-10-20 DIAGNOSIS — M9902 Segmental and somatic dysfunction of thoracic region: Secondary | ICD-10-CM | POA: Diagnosis not present

## 2016-10-20 DIAGNOSIS — M546 Pain in thoracic spine: Secondary | ICD-10-CM | POA: Diagnosis not present

## 2016-10-22 DIAGNOSIS — M545 Low back pain: Secondary | ICD-10-CM | POA: Diagnosis not present

## 2016-10-22 DIAGNOSIS — M9902 Segmental and somatic dysfunction of thoracic region: Secondary | ICD-10-CM | POA: Diagnosis not present

## 2016-10-22 DIAGNOSIS — M9903 Segmental and somatic dysfunction of lumbar region: Secondary | ICD-10-CM | POA: Diagnosis not present

## 2016-10-22 DIAGNOSIS — M546 Pain in thoracic spine: Secondary | ICD-10-CM | POA: Diagnosis not present

## 2016-11-01 DIAGNOSIS — M545 Low back pain: Secondary | ICD-10-CM | POA: Diagnosis not present

## 2016-11-01 DIAGNOSIS — M9902 Segmental and somatic dysfunction of thoracic region: Secondary | ICD-10-CM | POA: Diagnosis not present

## 2016-11-01 DIAGNOSIS — M546 Pain in thoracic spine: Secondary | ICD-10-CM | POA: Diagnosis not present

## 2016-11-01 DIAGNOSIS — M9903 Segmental and somatic dysfunction of lumbar region: Secondary | ICD-10-CM | POA: Diagnosis not present

## 2016-11-10 DIAGNOSIS — M9903 Segmental and somatic dysfunction of lumbar region: Secondary | ICD-10-CM | POA: Diagnosis not present

## 2016-11-10 DIAGNOSIS — M546 Pain in thoracic spine: Secondary | ICD-10-CM | POA: Diagnosis not present

## 2016-11-10 DIAGNOSIS — M9902 Segmental and somatic dysfunction of thoracic region: Secondary | ICD-10-CM | POA: Diagnosis not present

## 2016-11-10 DIAGNOSIS — M545 Low back pain: Secondary | ICD-10-CM | POA: Diagnosis not present

## 2016-11-19 DIAGNOSIS — M9903 Segmental and somatic dysfunction of lumbar region: Secondary | ICD-10-CM | POA: Diagnosis not present

## 2016-11-19 DIAGNOSIS — M9902 Segmental and somatic dysfunction of thoracic region: Secondary | ICD-10-CM | POA: Diagnosis not present

## 2016-11-19 DIAGNOSIS — M545 Low back pain: Secondary | ICD-10-CM | POA: Diagnosis not present

## 2016-11-19 DIAGNOSIS — R69 Illness, unspecified: Secondary | ICD-10-CM | POA: Diagnosis not present

## 2016-11-19 DIAGNOSIS — M546 Pain in thoracic spine: Secondary | ICD-10-CM | POA: Diagnosis not present

## 2016-12-14 DIAGNOSIS — H04123 Dry eye syndrome of bilateral lacrimal glands: Secondary | ICD-10-CM | POA: Diagnosis not present

## 2016-12-14 DIAGNOSIS — H40023 Open angle with borderline findings, high risk, bilateral: Secondary | ICD-10-CM | POA: Diagnosis not present

## 2017-01-03 DIAGNOSIS — Z1231 Encounter for screening mammogram for malignant neoplasm of breast: Secondary | ICD-10-CM | POA: Diagnosis not present

## 2017-06-27 DIAGNOSIS — H52203 Unspecified astigmatism, bilateral: Secondary | ICD-10-CM | POA: Diagnosis not present

## 2017-06-27 DIAGNOSIS — H40023 Open angle with borderline findings, high risk, bilateral: Secondary | ICD-10-CM | POA: Diagnosis not present

## 2017-06-27 DIAGNOSIS — H02834 Dermatochalasis of left upper eyelid: Secondary | ICD-10-CM | POA: Diagnosis not present

## 2017-06-27 DIAGNOSIS — H04123 Dry eye syndrome of bilateral lacrimal glands: Secondary | ICD-10-CM | POA: Diagnosis not present

## 2017-06-27 DIAGNOSIS — H43393 Other vitreous opacities, bilateral: Secondary | ICD-10-CM | POA: Diagnosis not present

## 2017-06-27 DIAGNOSIS — E119 Type 2 diabetes mellitus without complications: Secondary | ICD-10-CM | POA: Diagnosis not present

## 2017-06-27 DIAGNOSIS — H524 Presbyopia: Secondary | ICD-10-CM | POA: Diagnosis not present

## 2017-06-27 DIAGNOSIS — Z7984 Long term (current) use of oral hypoglycemic drugs: Secondary | ICD-10-CM | POA: Diagnosis not present

## 2017-06-27 DIAGNOSIS — H2513 Age-related nuclear cataract, bilateral: Secondary | ICD-10-CM | POA: Diagnosis not present

## 2017-06-27 DIAGNOSIS — H02831 Dermatochalasis of right upper eyelid: Secondary | ICD-10-CM | POA: Diagnosis not present

## 2017-07-19 DIAGNOSIS — D485 Neoplasm of uncertain behavior of skin: Secondary | ICD-10-CM | POA: Diagnosis not present

## 2017-07-19 DIAGNOSIS — D0472 Carcinoma in situ of skin of left lower limb, including hip: Secondary | ICD-10-CM | POA: Diagnosis not present

## 2017-08-23 DIAGNOSIS — D225 Melanocytic nevi of trunk: Secondary | ICD-10-CM | POA: Diagnosis not present

## 2017-08-23 DIAGNOSIS — D1801 Hemangioma of skin and subcutaneous tissue: Secondary | ICD-10-CM | POA: Diagnosis not present

## 2017-08-23 DIAGNOSIS — D485 Neoplasm of uncertain behavior of skin: Secondary | ICD-10-CM | POA: Diagnosis not present

## 2017-08-23 DIAGNOSIS — Z85828 Personal history of other malignant neoplasm of skin: Secondary | ICD-10-CM | POA: Diagnosis not present

## 2017-08-23 DIAGNOSIS — L918 Other hypertrophic disorders of the skin: Secondary | ICD-10-CM | POA: Diagnosis not present

## 2017-08-23 DIAGNOSIS — L298 Other pruritus: Secondary | ICD-10-CM | POA: Diagnosis not present

## 2017-08-23 DIAGNOSIS — L570 Actinic keratosis: Secondary | ICD-10-CM | POA: Diagnosis not present

## 2017-08-23 DIAGNOSIS — L821 Other seborrheic keratosis: Secondary | ICD-10-CM | POA: Diagnosis not present

## 2017-08-30 DIAGNOSIS — R1319 Other dysphagia: Secondary | ICD-10-CM | POA: Diagnosis not present

## 2017-08-30 DIAGNOSIS — E038 Other specified hypothyroidism: Secondary | ICD-10-CM | POA: Diagnosis not present

## 2017-08-30 DIAGNOSIS — E668 Other obesity: Secondary | ICD-10-CM | POA: Diagnosis not present

## 2017-08-30 DIAGNOSIS — M5136 Other intervertebral disc degeneration, lumbar region: Secondary | ICD-10-CM | POA: Diagnosis not present

## 2017-08-30 DIAGNOSIS — Z6831 Body mass index (BMI) 31.0-31.9, adult: Secondary | ICD-10-CM | POA: Diagnosis not present

## 2017-08-30 DIAGNOSIS — N393 Stress incontinence (female) (male): Secondary | ICD-10-CM | POA: Diagnosis not present

## 2017-08-30 DIAGNOSIS — E119 Type 2 diabetes mellitus without complications: Secondary | ICD-10-CM | POA: Diagnosis not present

## 2017-08-31 DIAGNOSIS — E038 Other specified hypothyroidism: Secondary | ICD-10-CM | POA: Diagnosis not present

## 2017-08-31 DIAGNOSIS — E785 Hyperlipidemia, unspecified: Secondary | ICD-10-CM | POA: Diagnosis not present

## 2017-08-31 DIAGNOSIS — M859 Disorder of bone density and structure, unspecified: Secondary | ICD-10-CM | POA: Diagnosis not present

## 2017-08-31 DIAGNOSIS — E119 Type 2 diabetes mellitus without complications: Secondary | ICD-10-CM | POA: Diagnosis not present

## 2017-08-31 DIAGNOSIS — N393 Stress incontinence (female) (male): Secondary | ICD-10-CM | POA: Diagnosis not present

## 2017-08-31 DIAGNOSIS — M5136 Other intervertebral disc degeneration, lumbar region: Secondary | ICD-10-CM | POA: Diagnosis not present

## 2017-09-05 DIAGNOSIS — E038 Other specified hypothyroidism: Secondary | ICD-10-CM | POA: Diagnosis not present

## 2017-09-20 DIAGNOSIS — Z1211 Encounter for screening for malignant neoplasm of colon: Secondary | ICD-10-CM | POA: Diagnosis not present

## 2017-09-20 DIAGNOSIS — Z1212 Encounter for screening for malignant neoplasm of rectum: Secondary | ICD-10-CM | POA: Diagnosis not present

## 2017-09-21 ENCOUNTER — Other Ambulatory Visit: Payer: Self-pay | Admitting: Internal Medicine

## 2017-09-21 DIAGNOSIS — E7800 Pure hypercholesterolemia, unspecified: Secondary | ICD-10-CM

## 2017-09-28 DIAGNOSIS — M545 Low back pain: Secondary | ICD-10-CM | POA: Diagnosis not present

## 2017-09-28 DIAGNOSIS — M9903 Segmental and somatic dysfunction of lumbar region: Secondary | ICD-10-CM | POA: Diagnosis not present

## 2017-09-28 DIAGNOSIS — M546 Pain in thoracic spine: Secondary | ICD-10-CM | POA: Diagnosis not present

## 2017-09-28 DIAGNOSIS — M9902 Segmental and somatic dysfunction of thoracic region: Secondary | ICD-10-CM | POA: Diagnosis not present

## 2017-09-29 ENCOUNTER — Other Ambulatory Visit: Payer: Self-pay

## 2017-09-29 DIAGNOSIS — M545 Low back pain: Secondary | ICD-10-CM | POA: Diagnosis not present

## 2017-09-29 DIAGNOSIS — M9902 Segmental and somatic dysfunction of thoracic region: Secondary | ICD-10-CM | POA: Diagnosis not present

## 2017-09-29 DIAGNOSIS — M9903 Segmental and somatic dysfunction of lumbar region: Secondary | ICD-10-CM | POA: Diagnosis not present

## 2017-09-29 DIAGNOSIS — M546 Pain in thoracic spine: Secondary | ICD-10-CM | POA: Diagnosis not present

## 2017-09-30 ENCOUNTER — Ambulatory Visit
Admission: RE | Admit: 2017-09-30 | Discharge: 2017-09-30 | Disposition: A | Discharge status: Home / Self Care | Payer: Self-pay | Source: Ambulatory Visit | Attending: Internal Medicine | Admitting: Internal Medicine

## 2017-09-30 DIAGNOSIS — E7800 Pure hypercholesterolemia, unspecified: Secondary | ICD-10-CM

## 2017-09-30 DIAGNOSIS — E785 Hyperlipidemia, unspecified: Secondary | ICD-10-CM | POA: Diagnosis not present

## 2017-10-03 DIAGNOSIS — M546 Pain in thoracic spine: Secondary | ICD-10-CM | POA: Diagnosis not present

## 2017-10-03 DIAGNOSIS — M9902 Segmental and somatic dysfunction of thoracic region: Secondary | ICD-10-CM | POA: Diagnosis not present

## 2017-10-03 DIAGNOSIS — M545 Low back pain: Secondary | ICD-10-CM | POA: Diagnosis not present

## 2017-10-03 DIAGNOSIS — M9903 Segmental and somatic dysfunction of lumbar region: Secondary | ICD-10-CM | POA: Diagnosis not present

## 2017-10-04 DIAGNOSIS — Z6831 Body mass index (BMI) 31.0-31.9, adult: Secondary | ICD-10-CM | POA: Diagnosis not present

## 2017-10-04 DIAGNOSIS — M546 Pain in thoracic spine: Secondary | ICD-10-CM | POA: Diagnosis not present

## 2017-10-04 DIAGNOSIS — M9902 Segmental and somatic dysfunction of thoracic region: Secondary | ICD-10-CM | POA: Diagnosis not present

## 2017-10-04 DIAGNOSIS — M545 Low back pain: Secondary | ICD-10-CM | POA: Diagnosis not present

## 2017-10-04 DIAGNOSIS — M5136 Other intervertebral disc degeneration, lumbar region: Secondary | ICD-10-CM | POA: Diagnosis not present

## 2017-10-04 DIAGNOSIS — M9903 Segmental and somatic dysfunction of lumbar region: Secondary | ICD-10-CM | POA: Diagnosis not present

## 2017-10-06 DIAGNOSIS — M545 Low back pain: Secondary | ICD-10-CM | POA: Diagnosis not present

## 2017-10-06 DIAGNOSIS — M9903 Segmental and somatic dysfunction of lumbar region: Secondary | ICD-10-CM | POA: Diagnosis not present

## 2017-10-06 DIAGNOSIS — M546 Pain in thoracic spine: Secondary | ICD-10-CM | POA: Diagnosis not present

## 2017-10-06 DIAGNOSIS — M9902 Segmental and somatic dysfunction of thoracic region: Secondary | ICD-10-CM | POA: Diagnosis not present

## 2017-10-24 DIAGNOSIS — M545 Low back pain: Secondary | ICD-10-CM | POA: Diagnosis not present

## 2017-10-24 DIAGNOSIS — M9902 Segmental and somatic dysfunction of thoracic region: Secondary | ICD-10-CM | POA: Diagnosis not present

## 2017-10-24 DIAGNOSIS — M546 Pain in thoracic spine: Secondary | ICD-10-CM | POA: Diagnosis not present

## 2017-10-24 DIAGNOSIS — M9903 Segmental and somatic dysfunction of lumbar region: Secondary | ICD-10-CM | POA: Diagnosis not present

## 2017-10-26 DIAGNOSIS — M545 Low back pain: Secondary | ICD-10-CM | POA: Diagnosis not present

## 2017-10-26 DIAGNOSIS — M9902 Segmental and somatic dysfunction of thoracic region: Secondary | ICD-10-CM | POA: Diagnosis not present

## 2017-10-26 DIAGNOSIS — M9903 Segmental and somatic dysfunction of lumbar region: Secondary | ICD-10-CM | POA: Diagnosis not present

## 2017-10-26 DIAGNOSIS — M546 Pain in thoracic spine: Secondary | ICD-10-CM | POA: Diagnosis not present

## 2017-10-27 DIAGNOSIS — M546 Pain in thoracic spine: Secondary | ICD-10-CM | POA: Diagnosis not present

## 2017-10-27 DIAGNOSIS — M545 Low back pain: Secondary | ICD-10-CM | POA: Diagnosis not present

## 2017-10-27 DIAGNOSIS — M9902 Segmental and somatic dysfunction of thoracic region: Secondary | ICD-10-CM | POA: Diagnosis not present

## 2017-10-27 DIAGNOSIS — M9903 Segmental and somatic dysfunction of lumbar region: Secondary | ICD-10-CM | POA: Diagnosis not present

## 2017-10-28 DIAGNOSIS — R69 Illness, unspecified: Secondary | ICD-10-CM | POA: Diagnosis not present

## 2017-11-01 DIAGNOSIS — M9902 Segmental and somatic dysfunction of thoracic region: Secondary | ICD-10-CM | POA: Diagnosis not present

## 2017-11-01 DIAGNOSIS — M546 Pain in thoracic spine: Secondary | ICD-10-CM | POA: Diagnosis not present

## 2017-11-01 DIAGNOSIS — M9903 Segmental and somatic dysfunction of lumbar region: Secondary | ICD-10-CM | POA: Diagnosis not present

## 2017-11-01 DIAGNOSIS — M545 Low back pain: Secondary | ICD-10-CM | POA: Diagnosis not present

## 2017-11-03 DIAGNOSIS — M546 Pain in thoracic spine: Secondary | ICD-10-CM | POA: Diagnosis not present

## 2017-11-03 DIAGNOSIS — M545 Low back pain: Secondary | ICD-10-CM | POA: Diagnosis not present

## 2017-11-03 DIAGNOSIS — M9902 Segmental and somatic dysfunction of thoracic region: Secondary | ICD-10-CM | POA: Diagnosis not present

## 2017-11-03 DIAGNOSIS — M9903 Segmental and somatic dysfunction of lumbar region: Secondary | ICD-10-CM | POA: Diagnosis not present

## 2017-11-08 DIAGNOSIS — M545 Low back pain: Secondary | ICD-10-CM | POA: Diagnosis not present

## 2017-11-08 DIAGNOSIS — M9902 Segmental and somatic dysfunction of thoracic region: Secondary | ICD-10-CM | POA: Diagnosis not present

## 2017-11-08 DIAGNOSIS — M9903 Segmental and somatic dysfunction of lumbar region: Secondary | ICD-10-CM | POA: Diagnosis not present

## 2017-11-08 DIAGNOSIS — M546 Pain in thoracic spine: Secondary | ICD-10-CM | POA: Diagnosis not present

## 2017-11-10 DIAGNOSIS — M9903 Segmental and somatic dysfunction of lumbar region: Secondary | ICD-10-CM | POA: Diagnosis not present

## 2017-11-10 DIAGNOSIS — M545 Low back pain: Secondary | ICD-10-CM | POA: Diagnosis not present

## 2017-11-10 DIAGNOSIS — M546 Pain in thoracic spine: Secondary | ICD-10-CM | POA: Diagnosis not present

## 2017-11-10 DIAGNOSIS — M9902 Segmental and somatic dysfunction of thoracic region: Secondary | ICD-10-CM | POA: Diagnosis not present

## 2017-11-14 DIAGNOSIS — M5441 Lumbago with sciatica, right side: Secondary | ICD-10-CM | POA: Diagnosis not present

## 2017-11-14 DIAGNOSIS — M542 Cervicalgia: Secondary | ICD-10-CM | POA: Diagnosis not present

## 2017-11-14 DIAGNOSIS — M9901 Segmental and somatic dysfunction of cervical region: Secondary | ICD-10-CM | POA: Diagnosis not present

## 2017-11-14 DIAGNOSIS — M9903 Segmental and somatic dysfunction of lumbar region: Secondary | ICD-10-CM | POA: Diagnosis not present

## 2017-11-24 DIAGNOSIS — M9901 Segmental and somatic dysfunction of cervical region: Secondary | ICD-10-CM | POA: Diagnosis not present

## 2017-11-24 DIAGNOSIS — M5441 Lumbago with sciatica, right side: Secondary | ICD-10-CM | POA: Diagnosis not present

## 2017-11-24 DIAGNOSIS — M9903 Segmental and somatic dysfunction of lumbar region: Secondary | ICD-10-CM | POA: Diagnosis not present

## 2017-11-24 DIAGNOSIS — M542 Cervicalgia: Secondary | ICD-10-CM | POA: Diagnosis not present

## 2017-11-28 DIAGNOSIS — M5441 Lumbago with sciatica, right side: Secondary | ICD-10-CM | POA: Diagnosis not present

## 2017-11-28 DIAGNOSIS — M9901 Segmental and somatic dysfunction of cervical region: Secondary | ICD-10-CM | POA: Diagnosis not present

## 2017-11-28 DIAGNOSIS — M9903 Segmental and somatic dysfunction of lumbar region: Secondary | ICD-10-CM | POA: Diagnosis not present

## 2017-11-28 DIAGNOSIS — M542 Cervicalgia: Secondary | ICD-10-CM | POA: Diagnosis not present

## 2017-12-01 DIAGNOSIS — M5441 Lumbago with sciatica, right side: Secondary | ICD-10-CM | POA: Diagnosis not present

## 2017-12-01 DIAGNOSIS — M9903 Segmental and somatic dysfunction of lumbar region: Secondary | ICD-10-CM | POA: Diagnosis not present

## 2017-12-01 DIAGNOSIS — M9901 Segmental and somatic dysfunction of cervical region: Secondary | ICD-10-CM | POA: Diagnosis not present

## 2017-12-01 DIAGNOSIS — M542 Cervicalgia: Secondary | ICD-10-CM | POA: Diagnosis not present

## 2017-12-05 DIAGNOSIS — M9903 Segmental and somatic dysfunction of lumbar region: Secondary | ICD-10-CM | POA: Diagnosis not present

## 2017-12-05 DIAGNOSIS — M9901 Segmental and somatic dysfunction of cervical region: Secondary | ICD-10-CM | POA: Diagnosis not present

## 2017-12-05 DIAGNOSIS — M542 Cervicalgia: Secondary | ICD-10-CM | POA: Diagnosis not present

## 2017-12-05 DIAGNOSIS — M5441 Lumbago with sciatica, right side: Secondary | ICD-10-CM | POA: Diagnosis not present

## 2017-12-06 DIAGNOSIS — M9903 Segmental and somatic dysfunction of lumbar region: Secondary | ICD-10-CM | POA: Diagnosis not present

## 2017-12-06 DIAGNOSIS — M542 Cervicalgia: Secondary | ICD-10-CM | POA: Diagnosis not present

## 2017-12-06 DIAGNOSIS — M5441 Lumbago with sciatica, right side: Secondary | ICD-10-CM | POA: Diagnosis not present

## 2017-12-06 DIAGNOSIS — M9901 Segmental and somatic dysfunction of cervical region: Secondary | ICD-10-CM | POA: Diagnosis not present

## 2017-12-08 DIAGNOSIS — M9901 Segmental and somatic dysfunction of cervical region: Secondary | ICD-10-CM | POA: Diagnosis not present

## 2017-12-08 DIAGNOSIS — M5441 Lumbago with sciatica, right side: Secondary | ICD-10-CM | POA: Diagnosis not present

## 2017-12-08 DIAGNOSIS — M9903 Segmental and somatic dysfunction of lumbar region: Secondary | ICD-10-CM | POA: Diagnosis not present

## 2017-12-08 DIAGNOSIS — M542 Cervicalgia: Secondary | ICD-10-CM | POA: Diagnosis not present

## 2017-12-13 DIAGNOSIS — M9903 Segmental and somatic dysfunction of lumbar region: Secondary | ICD-10-CM | POA: Diagnosis not present

## 2017-12-13 DIAGNOSIS — M542 Cervicalgia: Secondary | ICD-10-CM | POA: Diagnosis not present

## 2017-12-13 DIAGNOSIS — M9901 Segmental and somatic dysfunction of cervical region: Secondary | ICD-10-CM | POA: Diagnosis not present

## 2017-12-13 DIAGNOSIS — M5441 Lumbago with sciatica, right side: Secondary | ICD-10-CM | POA: Diagnosis not present

## 2017-12-19 DIAGNOSIS — M9903 Segmental and somatic dysfunction of lumbar region: Secondary | ICD-10-CM | POA: Diagnosis not present

## 2017-12-19 DIAGNOSIS — M542 Cervicalgia: Secondary | ICD-10-CM | POA: Diagnosis not present

## 2017-12-19 DIAGNOSIS — M9901 Segmental and somatic dysfunction of cervical region: Secondary | ICD-10-CM | POA: Diagnosis not present

## 2017-12-19 DIAGNOSIS — M5441 Lumbago with sciatica, right side: Secondary | ICD-10-CM | POA: Diagnosis not present

## 2017-12-26 DIAGNOSIS — M9903 Segmental and somatic dysfunction of lumbar region: Secondary | ICD-10-CM | POA: Diagnosis not present

## 2017-12-26 DIAGNOSIS — M9901 Segmental and somatic dysfunction of cervical region: Secondary | ICD-10-CM | POA: Diagnosis not present

## 2017-12-26 DIAGNOSIS — M542 Cervicalgia: Secondary | ICD-10-CM | POA: Diagnosis not present

## 2017-12-26 DIAGNOSIS — M5441 Lumbago with sciatica, right side: Secondary | ICD-10-CM | POA: Diagnosis not present

## 2018-01-02 DIAGNOSIS — H40023 Open angle with borderline findings, high risk, bilateral: Secondary | ICD-10-CM | POA: Diagnosis not present

## 2018-01-02 DIAGNOSIS — Z83511 Family history of glaucoma: Secondary | ICD-10-CM | POA: Diagnosis not present

## 2018-01-03 DIAGNOSIS — M5441 Lumbago with sciatica, right side: Secondary | ICD-10-CM | POA: Diagnosis not present

## 2018-01-03 DIAGNOSIS — M9903 Segmental and somatic dysfunction of lumbar region: Secondary | ICD-10-CM | POA: Diagnosis not present

## 2018-01-03 DIAGNOSIS — M9901 Segmental and somatic dysfunction of cervical region: Secondary | ICD-10-CM | POA: Diagnosis not present

## 2018-01-03 DIAGNOSIS — M542 Cervicalgia: Secondary | ICD-10-CM | POA: Diagnosis not present

## 2018-01-05 DIAGNOSIS — M5441 Lumbago with sciatica, right side: Secondary | ICD-10-CM | POA: Diagnosis not present

## 2018-01-05 DIAGNOSIS — M542 Cervicalgia: Secondary | ICD-10-CM | POA: Diagnosis not present

## 2018-01-05 DIAGNOSIS — M9901 Segmental and somatic dysfunction of cervical region: Secondary | ICD-10-CM | POA: Diagnosis not present

## 2018-01-05 DIAGNOSIS — M9903 Segmental and somatic dysfunction of lumbar region: Secondary | ICD-10-CM | POA: Diagnosis not present

## 2018-01-06 DIAGNOSIS — Z1231 Encounter for screening mammogram for malignant neoplasm of breast: Secondary | ICD-10-CM | POA: Diagnosis not present

## 2018-01-16 DIAGNOSIS — M542 Cervicalgia: Secondary | ICD-10-CM | POA: Diagnosis not present

## 2018-01-16 DIAGNOSIS — M5441 Lumbago with sciatica, right side: Secondary | ICD-10-CM | POA: Diagnosis not present

## 2018-01-16 DIAGNOSIS — M9903 Segmental and somatic dysfunction of lumbar region: Secondary | ICD-10-CM | POA: Diagnosis not present

## 2018-01-16 DIAGNOSIS — M9901 Segmental and somatic dysfunction of cervical region: Secondary | ICD-10-CM | POA: Diagnosis not present

## 2018-01-23 DIAGNOSIS — M5441 Lumbago with sciatica, right side: Secondary | ICD-10-CM | POA: Diagnosis not present

## 2018-01-23 DIAGNOSIS — M9901 Segmental and somatic dysfunction of cervical region: Secondary | ICD-10-CM | POA: Diagnosis not present

## 2018-01-23 DIAGNOSIS — M542 Cervicalgia: Secondary | ICD-10-CM | POA: Diagnosis not present

## 2018-01-23 DIAGNOSIS — M9903 Segmental and somatic dysfunction of lumbar region: Secondary | ICD-10-CM | POA: Diagnosis not present

## 2018-02-03 DIAGNOSIS — H5213 Myopia, bilateral: Secondary | ICD-10-CM | POA: Diagnosis not present

## 2018-02-03 DIAGNOSIS — Z0100 Encounter for examination of eyes and vision without abnormal findings: Secondary | ICD-10-CM | POA: Diagnosis not present

## 2018-02-15 DIAGNOSIS — M5441 Lumbago with sciatica, right side: Secondary | ICD-10-CM | POA: Diagnosis not present

## 2018-02-15 DIAGNOSIS — M9903 Segmental and somatic dysfunction of lumbar region: Secondary | ICD-10-CM | POA: Diagnosis not present

## 2018-02-16 DIAGNOSIS — M5441 Lumbago with sciatica, right side: Secondary | ICD-10-CM | POA: Diagnosis not present

## 2018-02-16 DIAGNOSIS — M9903 Segmental and somatic dysfunction of lumbar region: Secondary | ICD-10-CM | POA: Diagnosis not present

## 2018-02-27 DIAGNOSIS — D2262 Melanocytic nevi of left upper limb, including shoulder: Secondary | ICD-10-CM | POA: Diagnosis not present

## 2018-02-27 DIAGNOSIS — L821 Other seborrheic keratosis: Secondary | ICD-10-CM | POA: Diagnosis not present

## 2018-02-27 DIAGNOSIS — L918 Other hypertrophic disorders of the skin: Secondary | ICD-10-CM | POA: Diagnosis not present

## 2018-02-27 DIAGNOSIS — D225 Melanocytic nevi of trunk: Secondary | ICD-10-CM | POA: Diagnosis not present

## 2018-02-28 DIAGNOSIS — R1319 Other dysphagia: Secondary | ICD-10-CM | POA: Diagnosis not present

## 2018-02-28 DIAGNOSIS — I700 Atherosclerosis of aorta: Secondary | ICD-10-CM | POA: Diagnosis not present

## 2018-02-28 DIAGNOSIS — Z6831 Body mass index (BMI) 31.0-31.9, adult: Secondary | ICD-10-CM | POA: Diagnosis not present

## 2018-02-28 DIAGNOSIS — M545 Low back pain: Secondary | ICD-10-CM | POA: Diagnosis not present

## 2018-02-28 DIAGNOSIS — M5441 Lumbago with sciatica, right side: Secondary | ICD-10-CM | POA: Diagnosis not present

## 2018-02-28 DIAGNOSIS — E038 Other specified hypothyroidism: Secondary | ICD-10-CM | POA: Diagnosis not present

## 2018-02-28 DIAGNOSIS — E119 Type 2 diabetes mellitus without complications: Secondary | ICD-10-CM | POA: Diagnosis not present

## 2018-02-28 DIAGNOSIS — M9903 Segmental and somatic dysfunction of lumbar region: Secondary | ICD-10-CM | POA: Diagnosis not present

## 2018-03-02 DIAGNOSIS — M5441 Lumbago with sciatica, right side: Secondary | ICD-10-CM | POA: Diagnosis not present

## 2018-03-02 DIAGNOSIS — M9903 Segmental and somatic dysfunction of lumbar region: Secondary | ICD-10-CM | POA: Diagnosis not present

## 2018-03-09 DIAGNOSIS — M5441 Lumbago with sciatica, right side: Secondary | ICD-10-CM | POA: Diagnosis not present

## 2018-03-09 DIAGNOSIS — M9903 Segmental and somatic dysfunction of lumbar region: Secondary | ICD-10-CM | POA: Diagnosis not present

## 2018-03-15 DIAGNOSIS — M5441 Lumbago with sciatica, right side: Secondary | ICD-10-CM | POA: Diagnosis not present

## 2018-03-15 DIAGNOSIS — M9903 Segmental and somatic dysfunction of lumbar region: Secondary | ICD-10-CM | POA: Diagnosis not present

## 2018-03-22 DIAGNOSIS — M9903 Segmental and somatic dysfunction of lumbar region: Secondary | ICD-10-CM | POA: Diagnosis not present

## 2018-03-22 DIAGNOSIS — M5441 Lumbago with sciatica, right side: Secondary | ICD-10-CM | POA: Diagnosis not present

## 2018-03-30 DIAGNOSIS — M9903 Segmental and somatic dysfunction of lumbar region: Secondary | ICD-10-CM | POA: Diagnosis not present

## 2018-03-30 DIAGNOSIS — M5441 Lumbago with sciatica, right side: Secondary | ICD-10-CM | POA: Diagnosis not present

## 2018-04-05 DIAGNOSIS — M9903 Segmental and somatic dysfunction of lumbar region: Secondary | ICD-10-CM | POA: Diagnosis not present

## 2018-04-05 DIAGNOSIS — M5441 Lumbago with sciatica, right side: Secondary | ICD-10-CM | POA: Diagnosis not present

## 2018-04-06 DIAGNOSIS — M5441 Lumbago with sciatica, right side: Secondary | ICD-10-CM | POA: Diagnosis not present

## 2018-04-06 DIAGNOSIS — M9903 Segmental and somatic dysfunction of lumbar region: Secondary | ICD-10-CM | POA: Diagnosis not present

## 2018-04-10 DIAGNOSIS — M9903 Segmental and somatic dysfunction of lumbar region: Secondary | ICD-10-CM | POA: Diagnosis not present

## 2018-04-10 DIAGNOSIS — M5441 Lumbago with sciatica, right side: Secondary | ICD-10-CM | POA: Diagnosis not present

## 2018-04-11 DIAGNOSIS — M5441 Lumbago with sciatica, right side: Secondary | ICD-10-CM | POA: Diagnosis not present

## 2018-04-11 DIAGNOSIS — M9903 Segmental and somatic dysfunction of lumbar region: Secondary | ICD-10-CM | POA: Diagnosis not present

## 2018-04-12 DIAGNOSIS — M5441 Lumbago with sciatica, right side: Secondary | ICD-10-CM | POA: Diagnosis not present

## 2018-04-12 DIAGNOSIS — M9903 Segmental and somatic dysfunction of lumbar region: Secondary | ICD-10-CM | POA: Diagnosis not present

## 2018-04-13 DIAGNOSIS — M9904 Segmental and somatic dysfunction of sacral region: Secondary | ICD-10-CM | POA: Diagnosis not present

## 2018-04-13 DIAGNOSIS — M542 Cervicalgia: Secondary | ICD-10-CM | POA: Diagnosis not present

## 2018-04-13 DIAGNOSIS — M9901 Segmental and somatic dysfunction of cervical region: Secondary | ICD-10-CM | POA: Diagnosis not present

## 2018-04-13 DIAGNOSIS — M9903 Segmental and somatic dysfunction of lumbar region: Secondary | ICD-10-CM | POA: Diagnosis not present

## 2018-04-17 DIAGNOSIS — M9903 Segmental and somatic dysfunction of lumbar region: Secondary | ICD-10-CM | POA: Diagnosis not present

## 2018-04-17 DIAGNOSIS — M9904 Segmental and somatic dysfunction of sacral region: Secondary | ICD-10-CM | POA: Diagnosis not present

## 2018-04-17 DIAGNOSIS — M9901 Segmental and somatic dysfunction of cervical region: Secondary | ICD-10-CM | POA: Diagnosis not present

## 2018-04-17 DIAGNOSIS — M542 Cervicalgia: Secondary | ICD-10-CM | POA: Diagnosis not present

## 2018-04-20 DIAGNOSIS — M9901 Segmental and somatic dysfunction of cervical region: Secondary | ICD-10-CM | POA: Diagnosis not present

## 2018-04-20 DIAGNOSIS — M9904 Segmental and somatic dysfunction of sacral region: Secondary | ICD-10-CM | POA: Diagnosis not present

## 2018-04-20 DIAGNOSIS — M542 Cervicalgia: Secondary | ICD-10-CM | POA: Diagnosis not present

## 2018-04-20 DIAGNOSIS — M9903 Segmental and somatic dysfunction of lumbar region: Secondary | ICD-10-CM | POA: Diagnosis not present

## 2018-04-24 DIAGNOSIS — M9901 Segmental and somatic dysfunction of cervical region: Secondary | ICD-10-CM | POA: Diagnosis not present

## 2018-04-24 DIAGNOSIS — M542 Cervicalgia: Secondary | ICD-10-CM | POA: Diagnosis not present

## 2018-04-24 DIAGNOSIS — M9904 Segmental and somatic dysfunction of sacral region: Secondary | ICD-10-CM | POA: Diagnosis not present

## 2018-04-24 DIAGNOSIS — M9903 Segmental and somatic dysfunction of lumbar region: Secondary | ICD-10-CM | POA: Diagnosis not present

## 2018-04-25 DIAGNOSIS — M9904 Segmental and somatic dysfunction of sacral region: Secondary | ICD-10-CM | POA: Diagnosis not present

## 2018-04-25 DIAGNOSIS — M9901 Segmental and somatic dysfunction of cervical region: Secondary | ICD-10-CM | POA: Diagnosis not present

## 2018-04-25 DIAGNOSIS — M542 Cervicalgia: Secondary | ICD-10-CM | POA: Diagnosis not present

## 2018-04-25 DIAGNOSIS — M9903 Segmental and somatic dysfunction of lumbar region: Secondary | ICD-10-CM | POA: Diagnosis not present

## 2018-04-27 DIAGNOSIS — M9903 Segmental and somatic dysfunction of lumbar region: Secondary | ICD-10-CM | POA: Diagnosis not present

## 2018-04-27 DIAGNOSIS — M9901 Segmental and somatic dysfunction of cervical region: Secondary | ICD-10-CM | POA: Diagnosis not present

## 2018-04-27 DIAGNOSIS — M9904 Segmental and somatic dysfunction of sacral region: Secondary | ICD-10-CM | POA: Diagnosis not present

## 2018-04-27 DIAGNOSIS — M542 Cervicalgia: Secondary | ICD-10-CM | POA: Diagnosis not present

## 2018-06-20 DIAGNOSIS — K429 Umbilical hernia without obstruction or gangrene: Secondary | ICD-10-CM | POA: Diagnosis not present

## 2018-06-20 DIAGNOSIS — R1033 Periumbilical pain: Secondary | ICD-10-CM | POA: Diagnosis not present

## 2018-06-20 DIAGNOSIS — K2970 Gastritis, unspecified, without bleeding: Secondary | ICD-10-CM | POA: Diagnosis not present

## 2018-07-18 DIAGNOSIS — Z83511 Family history of glaucoma: Secondary | ICD-10-CM | POA: Diagnosis not present

## 2018-07-18 DIAGNOSIS — H40023 Open angle with borderline findings, high risk, bilateral: Secondary | ICD-10-CM | POA: Diagnosis not present

## 2018-07-18 DIAGNOSIS — H02834 Dermatochalasis of left upper eyelid: Secondary | ICD-10-CM | POA: Diagnosis not present

## 2018-07-18 DIAGNOSIS — Z7984 Long term (current) use of oral hypoglycemic drugs: Secondary | ICD-10-CM | POA: Diagnosis not present

## 2018-07-18 DIAGNOSIS — H524 Presbyopia: Secondary | ICD-10-CM | POA: Diagnosis not present

## 2018-07-18 DIAGNOSIS — H43393 Other vitreous opacities, bilateral: Secondary | ICD-10-CM | POA: Diagnosis not present

## 2018-07-18 DIAGNOSIS — H02831 Dermatochalasis of right upper eyelid: Secondary | ICD-10-CM | POA: Diagnosis not present

## 2018-07-18 DIAGNOSIS — H52203 Unspecified astigmatism, bilateral: Secondary | ICD-10-CM | POA: Diagnosis not present

## 2018-07-18 DIAGNOSIS — E119 Type 2 diabetes mellitus without complications: Secondary | ICD-10-CM | POA: Diagnosis not present

## 2018-07-18 DIAGNOSIS — H2513 Age-related nuclear cataract, bilateral: Secondary | ICD-10-CM | POA: Diagnosis not present

## 2018-09-13 DIAGNOSIS — L859 Epidermal thickening, unspecified: Secondary | ICD-10-CM | POA: Diagnosis not present

## 2018-09-13 DIAGNOSIS — L821 Other seborrheic keratosis: Secondary | ICD-10-CM | POA: Diagnosis not present

## 2018-09-13 DIAGNOSIS — D485 Neoplasm of uncertain behavior of skin: Secondary | ICD-10-CM | POA: Diagnosis not present

## 2018-09-13 DIAGNOSIS — L918 Other hypertrophic disorders of the skin: Secondary | ICD-10-CM | POA: Diagnosis not present

## 2018-09-13 DIAGNOSIS — Z23 Encounter for immunization: Secondary | ICD-10-CM | POA: Diagnosis not present

## 2018-09-13 DIAGNOSIS — D1801 Hemangioma of skin and subcutaneous tissue: Secondary | ICD-10-CM | POA: Diagnosis not present

## 2018-09-13 DIAGNOSIS — L820 Inflamed seborrheic keratosis: Secondary | ICD-10-CM | POA: Diagnosis not present

## 2018-09-13 DIAGNOSIS — D225 Melanocytic nevi of trunk: Secondary | ICD-10-CM | POA: Diagnosis not present

## 2018-10-02 DIAGNOSIS — R82998 Other abnormal findings in urine: Secondary | ICD-10-CM | POA: Diagnosis not present

## 2018-10-02 DIAGNOSIS — E038 Other specified hypothyroidism: Secondary | ICD-10-CM | POA: Diagnosis not present

## 2018-10-02 DIAGNOSIS — M859 Disorder of bone density and structure, unspecified: Secondary | ICD-10-CM | POA: Diagnosis not present

## 2018-10-02 DIAGNOSIS — E119 Type 2 diabetes mellitus without complications: Secondary | ICD-10-CM | POA: Diagnosis not present

## 2018-10-09 DIAGNOSIS — M5136 Other intervertebral disc degeneration, lumbar region: Secondary | ICD-10-CM | POA: Diagnosis not present

## 2018-10-09 DIAGNOSIS — M546 Pain in thoracic spine: Secondary | ICD-10-CM | POA: Diagnosis not present

## 2018-10-09 DIAGNOSIS — N393 Stress incontinence (female) (male): Secondary | ICD-10-CM | POA: Diagnosis not present

## 2018-10-09 DIAGNOSIS — Z0000 Encounter for general adult medical examination without abnormal findings: Secondary | ICD-10-CM | POA: Diagnosis not present

## 2018-10-09 DIAGNOSIS — E669 Obesity, unspecified: Secondary | ICD-10-CM | POA: Diagnosis not present

## 2018-10-09 DIAGNOSIS — K2970 Gastritis, unspecified, without bleeding: Secondary | ICD-10-CM | POA: Diagnosis not present

## 2018-10-09 DIAGNOSIS — E7870 Disorder of bile acid and cholesterol metabolism, unspecified: Secondary | ICD-10-CM | POA: Diagnosis not present

## 2018-10-09 DIAGNOSIS — I700 Atherosclerosis of aorta: Secondary | ICD-10-CM | POA: Diagnosis not present

## 2018-10-09 DIAGNOSIS — K429 Umbilical hernia without obstruction or gangrene: Secondary | ICD-10-CM | POA: Diagnosis not present

## 2018-10-09 DIAGNOSIS — M8580 Other specified disorders of bone density and structure, unspecified site: Secondary | ICD-10-CM | POA: Diagnosis not present

## 2018-10-21 DIAGNOSIS — R69 Illness, unspecified: Secondary | ICD-10-CM | POA: Diagnosis not present

## 2019-01-22 DIAGNOSIS — Z1231 Encounter for screening mammogram for malignant neoplasm of breast: Secondary | ICD-10-CM | POA: Diagnosis not present

## 2019-02-11 ENCOUNTER — Ambulatory Visit: Payer: Medicare HMO

## 2019-02-11 ENCOUNTER — Ambulatory Visit: Payer: Medicare HMO | Attending: Internal Medicine

## 2019-02-11 DIAGNOSIS — Z23 Encounter for immunization: Secondary | ICD-10-CM | POA: Insufficient documentation

## 2019-02-11 NOTE — Progress Notes (Signed)
   Covid-19 Vaccination Clinic  Name:  NALAH HAAN    MRN: [id number] DOB: 01/20/1950  02/11/2019  Ms. Galey was observed post Covid-19 immunization for 15 minutes without incidence. She was provided with Vaccine Information Sheet and instruction to access the V-Safe system.   Ms. Krolikowski was instructed to call 911 with any severe reactions post vaccine: Marland Kitchen Difficulty breathing  . Swelling of your face and throat  . A fast heartbeat  . A bad rash all over your body  . Dizziness and weakness    Immunizations Administered    Name Date Dose VIS Date Route   Pfizer COVID-19 Vaccine 02/11/2019 10:47 AM 0.3 mL 12/29/2018 Intramuscular   Manufacturer: [place]   Lot: [id number]   [place]: [id number]

## 2019-03-02 ENCOUNTER — Ambulatory Visit: Payer: Medicare HMO | Attending: Internal Medicine

## 2019-03-02 DIAGNOSIS — Z23 Encounter for immunization: Secondary | ICD-10-CM | POA: Insufficient documentation

## 2019-03-02 NOTE — Progress Notes (Signed)
   Covid-19 Vaccination Clinic  Name:  Taylor Lee    MRN: [id number] DOB: Dec 07, 1950  03/02/2019  [name] was observed post Covid-19 immunization for 15 minutes without incidence. She was provided with Vaccine Information Sheet and instruction to access the V-Safe system.   [name] was instructed to call 911 with any severe reactions post vaccine: Marland Kitchen Difficulty breathing  . Swelling of your face and throat  . A fast heartbeat  . A bad rash all over your body  . Dizziness and weakness    Immunizations Administered    Name Date Dose VIS Date Route   Pfizer COVID-19 Vaccine 03/02/2019  9:16 AM 0.3 mL 12/29/2018 Intramuscular   Manufacturer: [place]   Lot: [id number]   [place]: [id number]

## 2019-03-07 DIAGNOSIS — H40023 Open angle with borderline findings, high risk, bilateral: Secondary | ICD-10-CM | POA: Diagnosis not present

## 2019-03-09 ENCOUNTER — Ambulatory Visit: Payer: Self-pay

## 2019-04-10 DIAGNOSIS — L821 Other seborrheic keratosis: Secondary | ICD-10-CM | POA: Diagnosis not present

## 2019-04-10 DIAGNOSIS — D225 Melanocytic nevi of trunk: Secondary | ICD-10-CM | POA: Diagnosis not present

## 2019-04-10 DIAGNOSIS — L298 Other pruritus: Secondary | ICD-10-CM | POA: Diagnosis not present

## 2019-04-16 DIAGNOSIS — M8580 Other specified disorders of bone density and structure, unspecified site: Secondary | ICD-10-CM | POA: Diagnosis not present

## 2019-04-16 DIAGNOSIS — E1169 Type 2 diabetes mellitus with other specified complication: Secondary | ICD-10-CM | POA: Diagnosis not present

## 2019-04-16 DIAGNOSIS — E039 Hypothyroidism, unspecified: Secondary | ICD-10-CM | POA: Diagnosis not present

## 2019-04-16 DIAGNOSIS — K429 Umbilical hernia without obstruction or gangrene: Secondary | ICD-10-CM | POA: Diagnosis not present

## 2019-04-16 DIAGNOSIS — R0683 Snoring: Secondary | ICD-10-CM | POA: Diagnosis not present

## 2019-04-16 DIAGNOSIS — E7870 Disorder of bile acid and cholesterol metabolism, unspecified: Secondary | ICD-10-CM | POA: Diagnosis not present

## 2019-04-16 DIAGNOSIS — K2970 Gastritis, unspecified, without bleeding: Secondary | ICD-10-CM | POA: Diagnosis not present

## 2019-04-16 DIAGNOSIS — M545 Low back pain: Secondary | ICD-10-CM | POA: Diagnosis not present

## 2019-04-16 DIAGNOSIS — I700 Atherosclerosis of aorta: Secondary | ICD-10-CM | POA: Diagnosis not present

## 2019-04-18 DIAGNOSIS — E1169 Type 2 diabetes mellitus with other specified complication: Secondary | ICD-10-CM | POA: Diagnosis not present

## 2019-04-18 DIAGNOSIS — E038 Other specified hypothyroidism: Secondary | ICD-10-CM | POA: Diagnosis not present

## 2019-05-01 ENCOUNTER — Encounter: Payer: Self-pay | Admitting: Neurology

## 2019-05-02 ENCOUNTER — Ambulatory Visit: Payer: Medicare HMO | Admitting: Neurology

## 2019-05-02 ENCOUNTER — Encounter: Payer: Self-pay | Admitting: Neurology

## 2019-05-02 ENCOUNTER — Other Ambulatory Visit: Payer: Self-pay

## 2019-05-02 VITALS — BP 161/81 | HR 65 | Temp 97.5°F | Ht 65.0 in | Wt 186.0 lb

## 2019-05-02 DIAGNOSIS — Z9189 Other specified personal risk factors, not elsewhere classified: Secondary | ICD-10-CM | POA: Diagnosis not present

## 2019-05-02 DIAGNOSIS — Z8249 Family history of ischemic heart disease and other diseases of the circulatory system: Secondary | ICD-10-CM | POA: Diagnosis not present

## 2019-05-02 DIAGNOSIS — G4719 Other hypersomnia: Secondary | ICD-10-CM | POA: Diagnosis not present

## 2019-05-02 DIAGNOSIS — R0683 Snoring: Secondary | ICD-10-CM | POA: Diagnosis not present

## 2019-05-02 DIAGNOSIS — E669 Obesity, unspecified: Secondary | ICD-10-CM

## 2019-05-02 NOTE — Progress Notes (Signed)
Subjective:    Patient ID: Taylor Lee is a 69 y.o. female.  HPI     Star Age, MD, PhD [facility] [address], [address] [place], [place] [address]  Dear Dr. Joylene Draft,   I saw your patient, Taylor Lee, upon your kind request in my sleep clinic today for initial consultation of her sleep disorder, in particular, concern for underlying obstructive sleep apnea.  The patient is unaccompanied today.  As you know, Taylor Lee is a 69 year old right-handed woman with an underlying medical history of chronic low back pain, depression, anxiety, diabetes, hypothyroidism, osteopenia, hyperlipidemia, and borderline obesity, who reports loud snoring for years and some excessive daytime somnolence.  I reviewed your telemedicine note from 04/16/2019.  Her Epworth sleepiness score is 12/24, fatigue severity score is 17 out of 63.  She reports that she had been using a snore guard which was custom made by her dentist for years, it had helped her snoring quite a bit but has resulted and shifting of her teeth and malocclusion.  She has recently seen an orthodontist and may need braces or a retainer to realign her teeth but can no longer use her mouthguard done.  She has woken up with a sense of gasping from time to time, she has never had a sleep study.  She has no family history of sleep apnea but her father died young at 10 from heart disease and her paternal uncle also died at 49 from heart disease.  She has 2 grown children.  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  She lives with her husband who snores loudly and they currently sleep in different bedrooms because of his recent surgery.  She has a TV in her bedroom and it tends to be on at night, sometimes all night.  She has chronic difficulty initiating and maintaining sleep, primarily going to sleep.  She may get only 4 or 5 hours of sleep on any given night.  She does not have  night to night nocturia or recurrent morning headaches and denies any telltale symptoms of restless leg syndrome although her mother had severe RLS she reports.  Patient is a non-smoker, she drinks alcohol rarely, caffeine and limitation in the form of coffee, 2 cups/day on average.  They have no pets in the household.  She has been working on weight loss and altogether lost about 60 pounds, gained about 20 pounds back.  Bedtime varies quite a bit, she may not go to bed until early morning, rise time is typically fairly consistent and around 7 AM.    Her Past Medical History Is Significant For: Past Medical History:  Diagnosis Date  . DDD (degenerative disc disease), lumbosacral   . Depression   . HLD (hyperlipidemia)   . HNP (herniated nucleus pulposus)    I5-S1  . Osteopenia   . Rheumatic fever   . Thyroid disease     Her Past Surgical History Is Significant For: Past Surgical History:  Procedure Laterality Date  . CESAREAN SECTION    . NO PAST SURGERIES    . UMBILICAL HERNIA REPAIR  2016    Her Family History Is Significant For: Family History  Problem Relation Age of Onset  . Hip fracture Mother   . Heart disease Mother   . Sudden death Other   . Heart disease Other   . Heart attack Father     Her Social History Is Significant  For: Social History   Socioeconomic History  . Marital status: Married    Spouse name: Not on file  . Number of children: Not on file  . Years of education: Not on file  . Highest education level: Not on file  Occupational History  . Not on file  Tobacco Use  . Smoking status: Never Smoker  . Smokeless tobacco: Never Used  Substance and Sexual Activity  . Alcohol use: Yes  . Drug use: No  . Sexual activity: Not on file  Other Topics Concern  . Not on file  Social History Narrative   Married   Regular exercise- yes   Dog not sick   Mom had hip fracture was caretaker but doing well.   Social Determinants of Health   Financial  Resource Strain:   . Difficulty of Paying Living Expenses:   Food Insecurity:   . Worried About Charity fundraiser in the Last Year:   . Arboriculturist in the Last Year:   Transportation Needs:   . Film/video editor (Medical):   Marland Kitchen Lack of Transportation (Non-Medical):   Physical Activity:   . Days of Exercise per Week:   . Minutes of Exercise per Session:   Stress:   . Feeling of Stress :   Social Connections:   . Frequency of Communication with Friends and Family:   . Frequency of Social Gatherings with Friends and Family:   . Attends Religious Services:   . Active Member of Clubs or Organizations:   . Attends Archivist Meetings:   Marland Kitchen Marital Status:     Her Allergies Are:  Allergies  Allergen Reactions  . Prednisone Swelling  . Levothyroxine     Generics only cause dry mouth and not sleeping. Pt must have brand on this medication.  . Propoxyphene N-Acetaminophen     REACTION: unspecified  :   Her Current Medications Are:  Outpatient Encounter Medications as of 05/02/2019  Medication Sig  . HYDROcodone-acetaminophen (NORCO/VICODIN) 5-325 MG tablet Take 1 tablet by mouth 2 (two) times daily as needed for moderate pain.  Marland Kitchen liothyronine (CYTOMEL) 5 MCG tablet Take 5-10 mcg by mouth in the morning and at bedtime. 10 mg in am and 1 tablet in evening  . pantoprazole (PROTONIX) 40 MG tablet   . SYNTHROID 75 MCG tablet TAKE 1 TABLET BY MOUTH DAILY   No facility-administered encounter medications on file as of 05/02/2019.  :  Review of Systems:  Out of a complete 14 point review of systems, all are reviewed and negative with the exception of these symptoms as listed below: Review of Systems  Neurological:       Pt presents today because she snores. She has worn a snore guard and it has moved her teeth. She is having to get braces. She can't do that until can get rid of the snore guard. Without it she states she snores loudly, wakes herself up. Never had a SS. She  is here to discuss.   Epworth Sleepiness Scale 0= would never doze 1= slight chance of dozing 2= moderate chance of dozing 3= high chance of dozing  Sitting and reading:2 Watching TV:2 Sitting inactive in a public place (ex. Theater or meeting):1 As a passenger in a car for an hour without a break:1 Lying down to rest in the afternoon:3 Sitting and talking to someone:0 Sitting quietly after lunch (no alcohol):2 In a car, while stopped in traffic:1 Total:12     Objective:  Neurological Exam  Physical Exam Physical Examination:   Vitals:   05/02/19 0848  BP: (!) 161/81  Pulse: 65  Temp: (!) 97.5 F (36.4 C)    General Examination: The patient is a very pleasant 69 y.o. female in no acute distress. She appears well-developed and well-nourished and well groomed.   HEENT: Normocephalic, atraumatic, pupils are equal, round and reactive to light, extraocular tracking is good without limitation to gaze excursion or nystagmus noted. Hearing is grossly intact. Face is symmetric with normal facial animation. Speech is clear with no dysarthria noted. There is no hypophonia. There is no lip, neck/head, jaw or voice tremor. Neck is supple with full range of passive and active motion. There are no carotid bruits on auscultation. Oropharynx exam reveals: mild mouth dryness, good dental hygiene and moderate airway crowding, due to tonsillar size of 1-2+ bilaterally and larger uvula, appears to be mildly swollen and mild pharyngeal erythema is noted.  Tongue protrudes centrally in palate elevates symmetrically, Mallampati is class II, neck circumference is 16 inches.  Chest: Clear to auscultation without wheezing, rhonchi or crackles noted.  Heart: S1+S2+0, regular and normal without murmurs, rubs or gallops noted.   Abdomen: Soft, non-tender and non-distended with normal bowel sounds appreciated on auscultation.  Extremities: There is no pitting edema in the distal lower extremities  bilaterally. Good pedal pulses.  Skin: Warm and dry without trophic changes noted.   Musculoskeletal: exam reveals no obvious joint deformities, tenderness or joint swelling or erythema.   Neurologically:  Mental status: The patient is awake, alert and oriented in all 4 spheres. Her immediate and remote memory, attention, language skills and fund of knowledge are appropriate. There is no evidence of aphasia, agnosia, apraxia or anomia. Speech is clear with normal prosody and enunciation. Thought process is linear. Mood is normal and affect is normal.  Cranial nerves II - XII are as described above under HEENT exam.  Motor exam: Normal bulk, strength and tone is noted. There is no tremor, Romberg is negative. Fine motor skills and coordination: grossly intact.  Cerebellar testing: No dysmetria or intention tremor. There is no truncal or gait ataxia.  Sensory exam: intact to light touch in the upper and lower extremities.  Gait, station and balance: She stands easily. No veering to one side is noted. No leaning to one side is noted. Posture is age-appropriate and stance is narrow based. Gait shows normal stride length and normal pace. No problems turning are noted. Tandem walk is unremarkable.                Assessment and plan:   In summary, Taylor Lee is a very pleasant 69 y.o.-year old female with an underlying medical history of chronic low back pain, depression, anxiety, diabetes, hypothyroidism, osteopenia, hyperlipidemia, and borderline obesity, whose history and physical exam are concerning for obstructive sleep apnea (OSA). I had a long chat with the patient about my findings and the diagnosis of OSA, its prognosis and treatment options. We talked about medical treatments, surgical interventions and non-pharmacological approaches. I explained in particular the risks and ramifications of untreated moderate to severe OSA, especially with respect to developing cardiovascular disease down  the Road, including congestive heart failure, difficult to treat hypertension, cardiac arrhythmias, or stroke. Even type 2 diabetes has, in part, been linked to untreated OSA. Symptoms of untreated OSA include daytime sleepiness, memory problems, mood irritability and mood disorder such as depression and anxiety, lack of energy, as well as recurrent headaches,  especially morning headaches. We talked about trying to maintain a healthy lifestyle in general, as well as the importance of weight control. We also talked about the importance of good sleep hygiene. I recommended the following at this time: sleep study.  I explained the sleep test procedure to the patient and also outlined possible surgical and non-surgical treatment options of OSA, including the use of a custom-made dental device (which would require a referral to a specialist dentist or oral surgeon), upper airway surgical options, such as traditional UPPP or a novel less invasive surgical option in the form of Inspire hypoglossal nerve stimulation (which would involve a referral to an ENT surgeon). I also explained the CPAP treatment option to the patient, who indicated that she would be willing to try CPAP if the need arises. I explained the importance of being compliant with PAP treatment, not only for insurance purposes but primarily to improve Her symptoms, and for the patient's long term health benefit, including to reduce Her cardiovascular risks. I answered all her questions today and the patient was in agreement. I plan to see her back after the sleep study is completed and encouraged her to call with any interim questions, concerns, problems or updates.   Thank you very much for allowing me to participate in the care of this nice patient. If I can be of any further assistance to you please do not hesitate to call me at [phone].  Sincerely,   Star Age, MD, PhD

## 2019-05-02 NOTE — Patient Instructions (Signed)

## 2019-05-30 ENCOUNTER — Other Ambulatory Visit: Payer: Self-pay

## 2019-05-30 ENCOUNTER — Ambulatory Visit (INDEPENDENT_AMBULATORY_CARE_PROVIDER_SITE_OTHER): Payer: Medicare HMO | Admitting: Neurology

## 2019-05-30 DIAGNOSIS — Z8249 Family history of ischemic heart disease and other diseases of the circulatory system: Secondary | ICD-10-CM

## 2019-05-30 DIAGNOSIS — R0683 Snoring: Secondary | ICD-10-CM

## 2019-05-30 DIAGNOSIS — Z9189 Other specified personal risk factors, not elsewhere classified: Secondary | ICD-10-CM

## 2019-05-30 DIAGNOSIS — G4733 Obstructive sleep apnea (adult) (pediatric): Secondary | ICD-10-CM

## 2019-05-30 DIAGNOSIS — G4719 Other hypersomnia: Secondary | ICD-10-CM

## 2019-05-30 DIAGNOSIS — E669 Obesity, unspecified: Secondary | ICD-10-CM

## 2019-06-01 DIAGNOSIS — L570 Actinic keratosis: Secondary | ICD-10-CM | POA: Diagnosis not present

## 2019-06-01 NOTE — Procedures (Signed)
Patient Information     First Name: Taylor Lee Last Name: Aliana Laverne: [id number]  Birth Date: September 23, 1950 Age: 69 Gender: Female  Referring Provider: Burnis Medin, MD BMI: 30.9 (W=185 lb, H=5' 5'')  Neck Circ.:  16 '' Epworth:  12/24   Sleep Study Information    Study Date: May 30, 2019 S/H/A Version: [id number] / 4.1.1528 / 49  History:    69 year old woman with a history of chronic low back pain, depression, anxiety, diabetes, hypothyroidism, osteopenia, hyperlipidemia, and borderline obesity, who reports loud snoring for years and some daytime somnolence. Summary & Diagnosis:     Severe OSA  Recommendations:      This home sleep test demonstrates severe obstructive sleep apnea with a total AHI of 45.9/hour and O2 nadir of 78%. Treatment with positive airway pressure (in the form of CPAP) is recommended. This will require a full night CPAP titration study for proper treatment settings, O2 monitoring and mask fitting. Based on the severity of the sleep disordered breathing an attended titration study is indicated. However, patient's insurance has denied an attended sleep study; therefore, the patient will be advised to proceed with an autoPAP titration/trial at home for now. Please note that untreated obstructive sleep apnea may carry additional perioperative morbidity. Patients with significant obstructive sleep apnea should receive perioperative PAP therapy and the surgeons and particularly the anesthesiologist should be informed of the diagnosis and the severity of the sleep disordered breathing. The patient should be cautioned not to drive, work at heights, or operate dangerous or heavy equipment when tired or sleepy. Review and reiteration of good sleep hygiene measures should be pursued with any patient. Other causes of the patient's symptoms, including circadian rhythm disturbances, an underlying mood disorder, medication effect and/or an underlying medical problem cannot be ruled out  based on this test. Clinical correlation is recommended. The patient and his referring provider will be notified of the test results. The patient will be seen in follow up in sleep clinic at [facility].  I certify that I have reviewed the raw data recording prior to the issuance of this report in accordance with the standards of the American Academy of Sleep Medicine (AASM).  Star Age, MD, PhD [facility]) Diplomat, ABPN (Neurology and Sleep)             Sleep Summary  Oxygen Saturation Statistics   Start Study Time: End Study Time: Total Recording Time:       11:37:44 PM 6:28:57 AM 6 h, 51 min  Total Sleep Time % REM of Sleep Time:  5 h, 52 min 24.3    Mean: 92 Minimum: 78 Maximum: 98  Mean of Desaturations Nadirs (%):   89  Oxygen Desaturation. %:   4-9 10-20 >20 Total  Events Number Total   124  27 82.1 17.9  0 0.0  151 100.0  Oxygen Saturation: <90 <=88 <85 <80 <70  Duration (minutes): Sleep % 18.4 5.2 11.8 2.2 3.4 0.6 0.1 0.0 0.0 0.0     Respiratory Indices      Total Events REM NREM All Night  pRDI:  238  pAHI:  227 ODI:  151  pAHIc:  10  % CSR: 0.0 56.9 55.6 55.6 1.4 46.6 44.2 26.2 2.1 48.1 45.9 30.5 2.0       Pulse Rate Statistics during Sleep (BPM)      Mean:  63 Minimum: 48 Maximum: 98    Indices are calculated using technically valid sleep  time of 4 h, 57 min. pRDI/pAHI are calculated using oxi desaturations ? 3%  Body Position Statistics  Position Supine Prone Right Left Non-Supine  Sleep (min) 307.5 0.0 3.5 0.0 3.5  Sleep % 87.2 0.0 1.0 0.0 1.0  pRDI 48.0 N/A N/A N/A N/A  pAHI 45.8 N/A N/A N/A N/A  ODI 31.8 N/A N/A N/A N/A     Snoring Statistics Snoring Level (dB) >40 >50 >60 >70 >80 >Threshold (45)  Sleep (min) 35.1 4.2 1.7 0.0 0.0 7.6  Sleep % 9.9 1.2 0.5 0.0 0.0 2.1    Mean: 40 dB Sleep Stages Chart                                                                      pAHI=45.9                                                                                   Mild              Moderate                    Severe                                                 5              15                    30

## 2019-06-01 NOTE — Addendum Note (Signed)
Addended by: Star Age on: 06/01/2019 03:07 PM   Modules accepted: Orders

## 2019-06-01 NOTE — Progress Notes (Signed)
Patient referred by Dr. Joylene Draft, seen by me on 05/02/19, HST on 05/30/19.    Please call and notify the patient that the recent home sleep test showed obstructive sleep apnea in the severe range. While I recommend treatment for this in the form CPAP, her insurance will not approve a sleep study for this. They will likely only approve a trial of autoPAP, which means, that we don't have to bring her in for a sleep study with CPAP, but will let her start using a so called autoPAP machine at home, through a DME company (of her choice, or as per insurance requirement). The DME representative will educate her on how to use the machine, how to put the mask on, etc. I have placed an order in the chart. Please send referral, talk to patient, send report to referring MD. We will need a FU in sleep clinic for 10 weeks post-PAP set up, please arrange that with me or one of our NPs. Thanks,   Star Age, MD, PhD [facility])

## 2019-06-04 ENCOUNTER — Telehealth: Payer: Self-pay

## 2019-06-04 NOTE — Telephone Encounter (Signed)
I called pt to discuss. No answer, left a message asking her to call me back. 

## 2019-06-04 NOTE — Telephone Encounter (Signed)
-----   Message from Star Age, MD sent at 06/01/2019  3:07 PM EDT ----- Patient referred by Dr. Joylene Draft, seen by me on 05/02/19, HST on 05/30/19.    Please call and notify the patient that the recent home sleep test showed obstructive sleep apnea in the severe range. While I recommend treatment for this in the form CPAP, her insurance will not approve a sleep study for this. They will likely only approve a trial of autoPAP, which means, that we don't have to bring her in for a sleep study with CPAP, but will let her start using a so called autoPAP machine at home, through a DME company (of her choice, or as per insurance requirement). The DME representative will educate her on how to use the machine, how to put the mask on, etc. I have placed an order in the chart. Please send referral, talk to patient, send report to referring MD. We will need a FU in sleep clinic for 10 weeks post-PAP set up, please arrange that with me or one of our NPs. Thanks,   Star Age, MD, PhD [facility])

## 2019-06-11 NOTE — Telephone Encounter (Signed)
I called pt. I advised pt that Dr. Rexene Alberts reviewed their sleep study results and found that pt has severe osa. Dr. Rexene Alberts recommends that pt start an auto pap at home since pt's insurance will not approve a titration study. I reviewed PAP compliance expectations with the pt. Pt is agreeable to starting an auto-PAP. I advised pt that an order will be sent to a DME, [facility], and [facility] will call the pt within about one week after they file with the pt's insurance. [facility] will show the pt how to use the machine, fit for masks, and troubleshoot the auto-PAP if needed. A follow up appt was made for insurance purposes with Jinny Blossom, NP on 08/29/2019 at 8:30am. Pt verbalized understanding to arrive 15 minutes early and bring their auto-PAP. A letter with all of this information in it will be mailed to the pt as a reminder. I verified with the pt that the address we have on file is correct. Pt verbalized understanding of results. Pt had no questions at this time but was encouraged to call back if questions arise. I have sent the order to [facility] and have received confirmation that they have received the order.

## 2019-06-22 IMAGING — CT CT HEART SCORING
3 series · 14 of 20 positions shown, 16 images · non-contrast
Comparison: None.

CLINICAL DATA: High cholesterol

EXAM:
CT HEART FOR CALCIUM SCORING
TECHNIQUE: CT heart was performed on a 64 channel system using prospective ECG
gating.
A non-contrast exam for calcium scoring was performed.
Note that this exam targets the heart and the chest was not imaged
in its entirety.

[Series 2: calcium scoring 2.00 qr36 bestdiast 70% · axial · 0.36mm/px · z∈[+1609,+1693]mm · 4 of 70 slices shown]
[im 14/70  vessel]
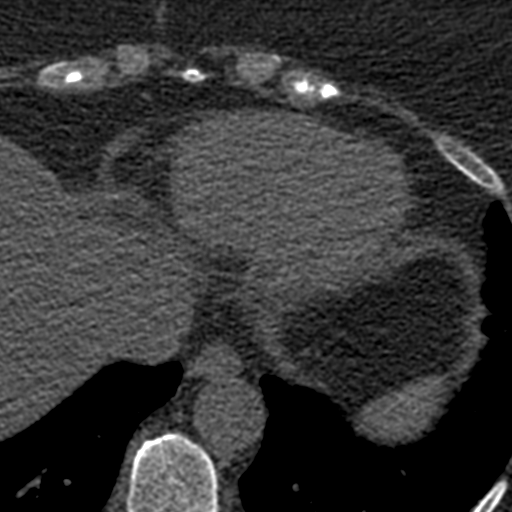
[im 28/70  vessel]
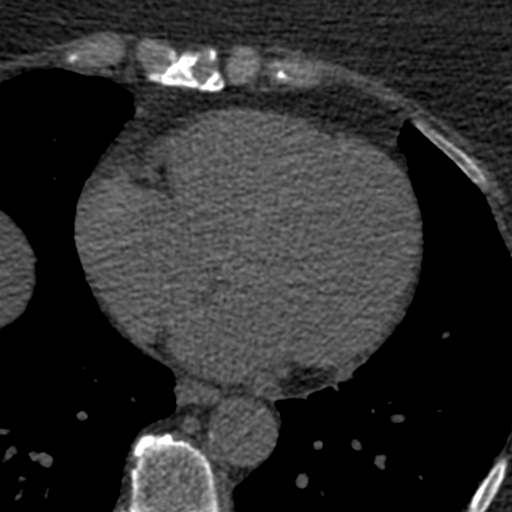
[im 42/70  vessel]
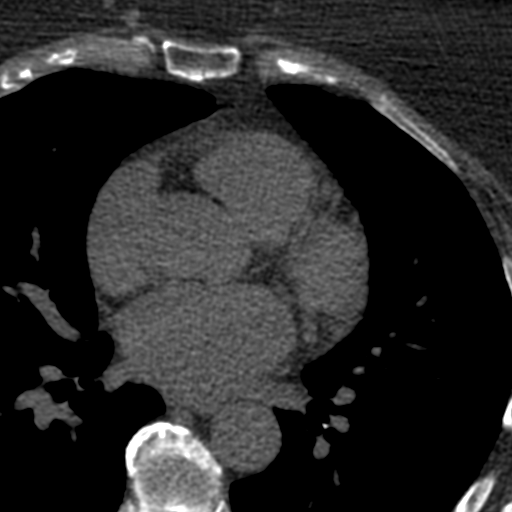
[im 56/70  vessel]
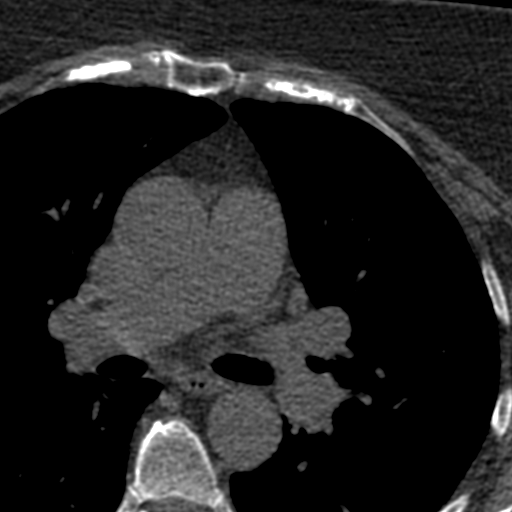

[Series 3: calcium scoring 2.00 br40 bestdiast 70% ax fov · axial · 0.54mm/px · z∈[+1605,+1697]mm · 5 of 70 slices shown, 7 images]
[im 12/70  vessel]
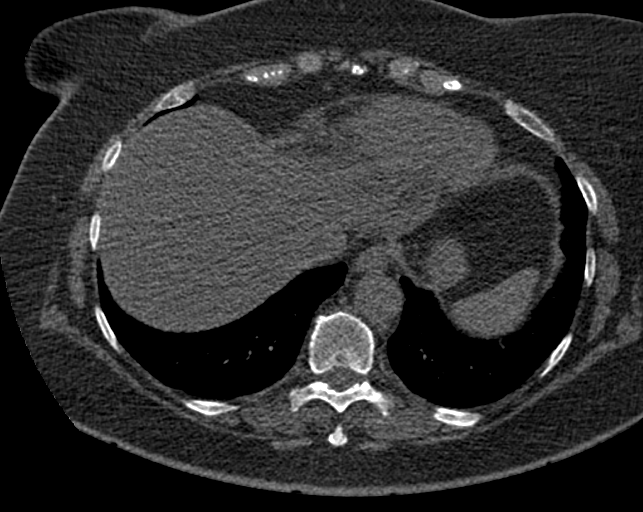
[im 12/70  lung]
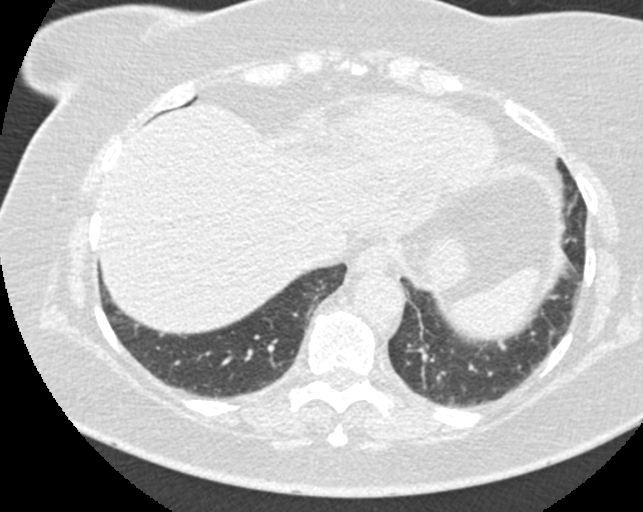
[im 24/70  vessel]
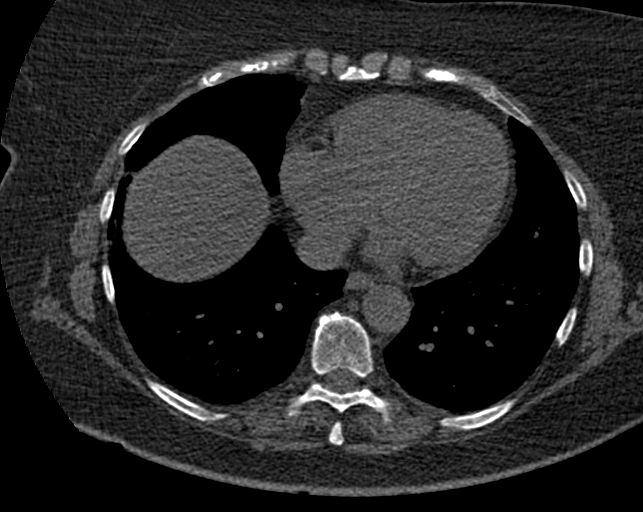
[im 35/70  vessel]
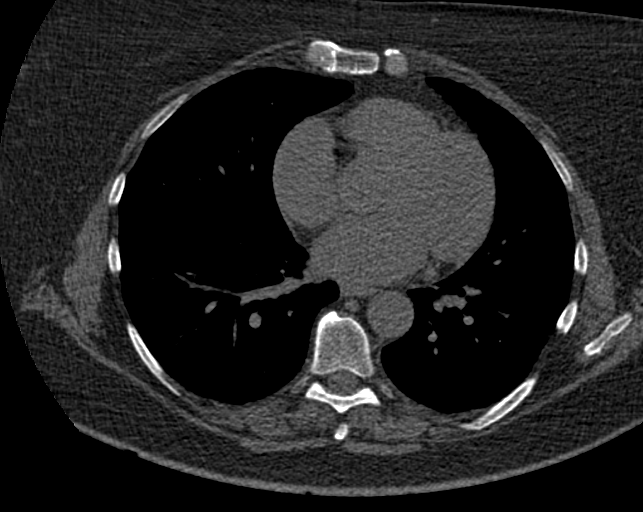
[im 47/70  vessel]
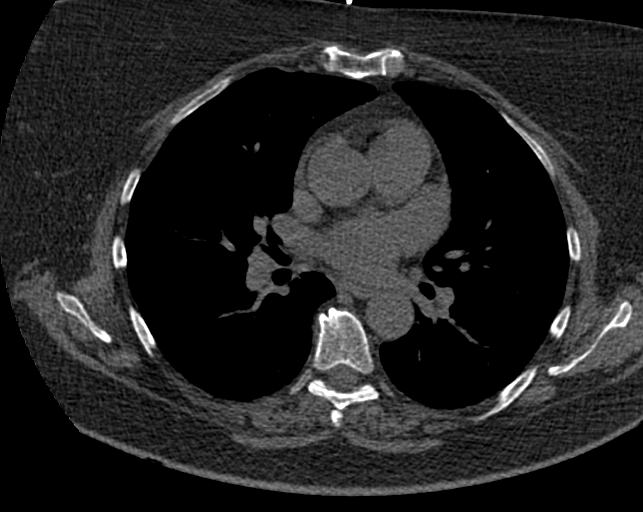
[im 58/70  vessel]
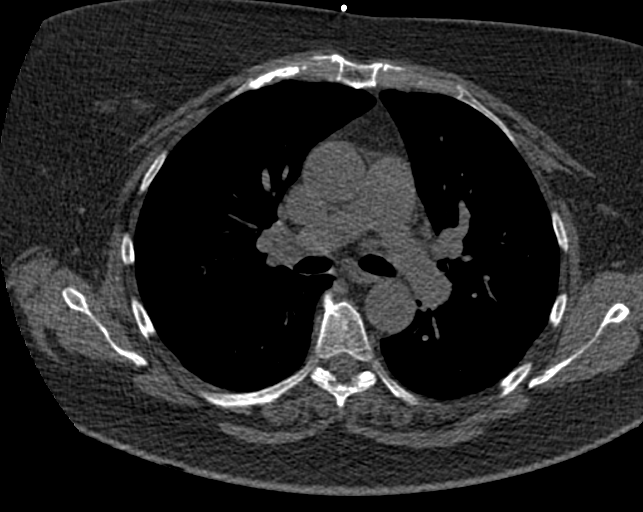
[im 58/70  lung]
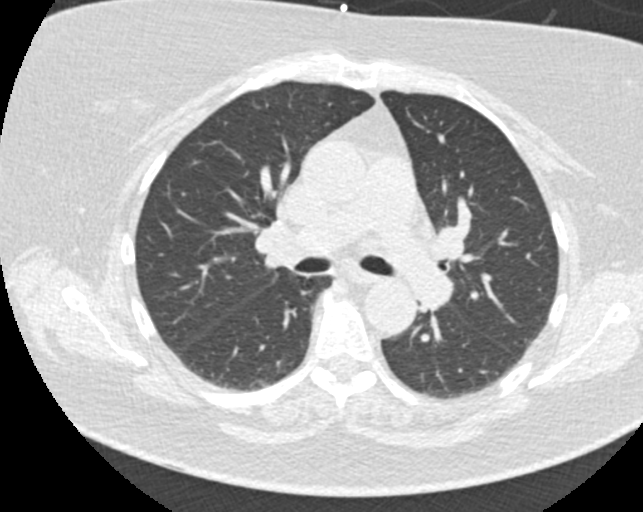

[Series 9: calcium scoring 2.00 br60 bestdiast 70% ax fov · axial · 0.54mm/px · z∈[+1605,+1697]mm · 5 of 70 slices shown]
[im 12/70  vessel]
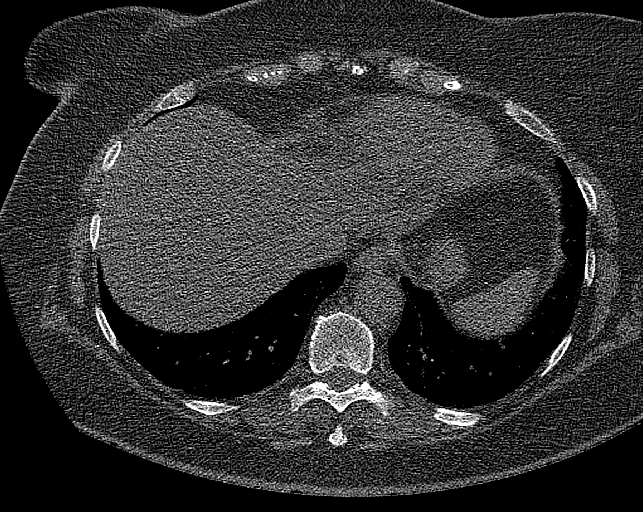
[im 24/70  vessel]
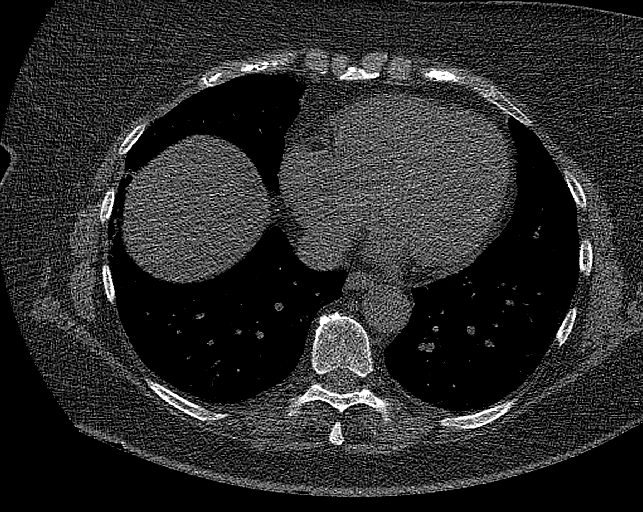
[im 35/70  vessel]
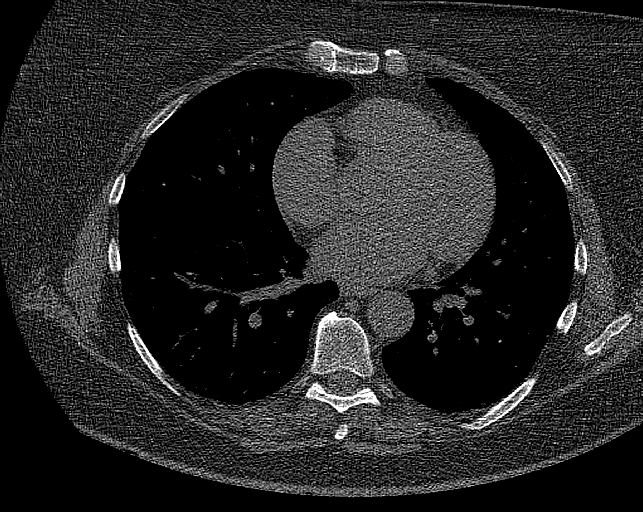
[im 47/70  vessel]
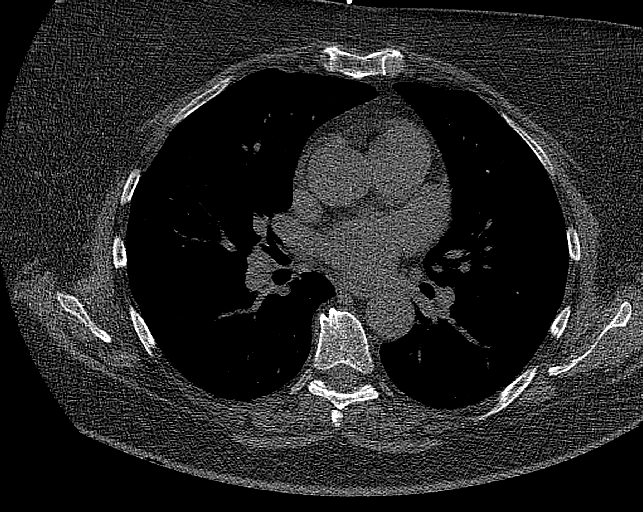
[im 58/70  vessel]
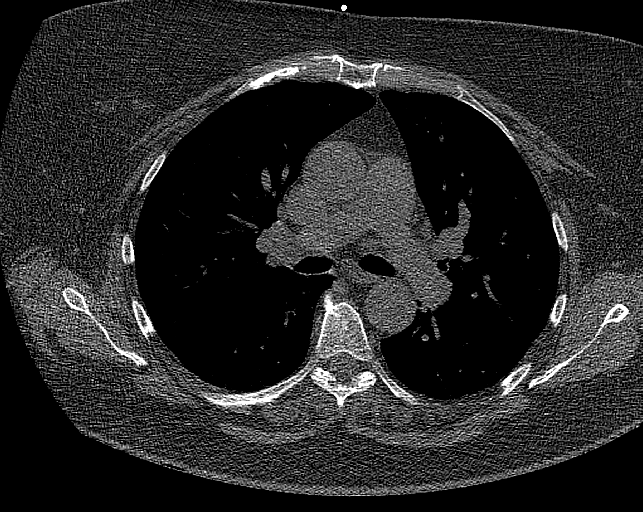

[14 of 20 positions shown; findings below may reference images not displayed]

FINDINGS: Technical quality: Good.

CORONARY CALCIUM

Total Agatston Score: 0

[HOSPITAL] percentile:  0

OTHER FINDINGS:

Cardiovascular: Heart is normal size. Visualized aorta is normal
caliber. Few calcifications in the distal descending thoracic aorta.

Mediastinum/Nodes: No adenopathy in the lower mediastinum or hila.

Lungs/Pleura: Calcified granuloma in the right lower lobe at the
right lung base. No effusions.

Upper Abdomen: Imaging into the upper abdomen shows no acute
findings.

Musculoskeletal: Chest wall soft tissues are unremarkable. No acute
bony abnormality.
IMPRESSION: No visible coronary artery calcifications. Total coronary calcium
score of 0.

Minimal scattered descending thoracic aortic calcifications.

Old granuloma in the right lower lobe.

## 2019-06-25 DIAGNOSIS — G4733 Obstructive sleep apnea (adult) (pediatric): Secondary | ICD-10-CM | POA: Diagnosis not present

## 2019-07-25 DIAGNOSIS — G4733 Obstructive sleep apnea (adult) (pediatric): Secondary | ICD-10-CM | POA: Diagnosis not present

## 2019-08-15 DIAGNOSIS — G4733 Obstructive sleep apnea (adult) (pediatric): Secondary | ICD-10-CM | POA: Diagnosis not present

## 2019-08-25 DIAGNOSIS — G4733 Obstructive sleep apnea (adult) (pediatric): Secondary | ICD-10-CM | POA: Diagnosis not present

## 2019-08-27 ENCOUNTER — Encounter: Payer: Self-pay | Admitting: Adult Health

## 2019-08-29 ENCOUNTER — Encounter: Payer: Self-pay | Admitting: Adult Health

## 2019-08-29 ENCOUNTER — Ambulatory Visit: Payer: Medicare HMO | Admitting: Adult Health

## 2019-08-29 VITALS — BP 141/76 | HR 59 | Ht 65.0 in | Wt 188.0 lb

## 2019-08-29 DIAGNOSIS — Z9989 Dependence on other enabling machines and devices: Secondary | ICD-10-CM

## 2019-08-29 DIAGNOSIS — G4733 Obstructive sleep apnea (adult) (pediatric): Secondary | ICD-10-CM | POA: Diagnosis not present

## 2019-08-29 NOTE — Progress Notes (Addendum)
PATIENT: Taylor Lee DOB: 1950-10-08  REASON FOR VISIT: follow up HISTORY FROM: patient  HISTORY OF PRESENT ILLNESS: Today 08/29/19:  Taylor Lee is a 69 year old female with a history of obstructive sleep apnea on CPAP.  Her download indicates that she use her machine 29 out of 30 days for compliance of 97%.  She use her machine greater then 4 hours 27 out of 30 days for compliance of 90%.  On average she uses her machine 5 hours and 40 minutes.  Her residual AHI is 0.5 on 7 to 13 cm of water with EPR of 1.  Leak in the 95th percentile is 23.9 L/min.  HISTORY (copied from Dr. Guadelupe Sabin note)  Taylor Lee is a 69 year old right-handed woman with an underlying medical history of chronic low back pain, depression, anxiety, diabetes, hypothyroidism, osteopenia, hyperlipidemia, and borderline obesity, who reports loud snoring for years and some excessive daytime somnolence.  I reviewed your telemedicine note from 04/16/2019.  Her Epworth sleepiness score is 12/24, fatigue severity score is 17 out of 63.  She reports that she had been using a snore guard which was custom made by her dentist for years, it had helped her snoring quite a bit but has resulted and shifting of her teeth and malocclusion.  She has recently seen an orthodontist and may need braces or a retainer to realign her teeth but can no longer use her mouthguard done.  She has woken up with a sense of gasping from time to time, she has never had a sleep study.  She has no family history of sleep apnea but her father died young at 27 from heart disease and her paternal uncle also died at 47 from heart disease.  She has 2 grown children.  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  She lives with her husband who snores loudly and they currently sleep in different bedrooms because of his recent surgery.  She has a TV in her bedroom and it tends to be on at night, sometimes all night.  She has chronic  difficulty initiating and maintaining sleep, primarily going to sleep.  She may get only 4 or 5 hours of sleep on any given night.  She does not have night to night nocturia or recurrent morning headaches and denies any telltale symptoms of restless leg syndrome although her mother had severe RLS she reports.  Patient is a non-smoker, she drinks alcohol rarely, caffeine and limitation in the form of coffee, 2 cups/day on average.  They have no pets in the household.  She has been working on weight loss and altogether lost about 60 pounds, gained about 20 pounds back.  Bedtime varies quite a bit, she may not go to bed until early morning, rise time is typically fairly consistent and around 7 AM.  REVIEW OF SYSTEMS: Out of a complete 14 system review of symptoms, the patient complains only of the following symptoms, and all other reviewed systems are negative.  FSS 17 ESS 6  ALLERGIES: Allergies  Allergen Reactions  . Prednisone Swelling  . Levothyroxine     Generics only cause dry mouth and not sleeping. Pt must have brand on this medication.  . Propoxyphene N-Acetaminophen     REACTION: unspecified    HOME MEDICATIONS: Outpatient Medications Prior to Visit  Medication Sig Dispense Refill  . HYDROcodone-acetaminophen (NORCO/VICODIN) 5-325 MG tablet Take 1 tablet by mouth 2 (two) times daily as needed for moderate pain.    Marland Kitchen  liothyronine (CYTOMEL) 5 MCG tablet Take 5-10 mcg by mouth in the morning and at bedtime. 10 mg in am and 1 tablet in evening    . pantoprazole (PROTONIX) 40 MG tablet     . SYNTHROID 75 MCG tablet TAKE 1 TABLET BY MOUTH DAILY 30 tablet 1   No facility-administered medications prior to visit.    PAST MEDICAL HISTORY: Past Medical History:  Diagnosis Date  . DDD (degenerative disc disease), lumbosacral   . Depression   . HLD (hyperlipidemia)   . HNP (herniated nucleus pulposus)    I5-S1  . Osteopenia   . Rheumatic fever   . Thyroid disease     PAST SURGICAL  HISTORY: Past Surgical History:  Procedure Laterality Date  . CESAREAN SECTION    . NO PAST SURGERIES    . UMBILICAL HERNIA REPAIR  2016    FAMILY HISTORY: Family History  Problem Relation Age of Onset  . Hip fracture Mother   . Heart disease Mother   . Sudden death Other   . Heart disease Other   . Heart attack Father     SOCIAL HISTORY: Social History   Socioeconomic History  . Marital status: Married    Spouse name: Not on file  . Number of children: Not on file  . Years of education: Not on file  . Highest education level: Not on file  Occupational History  . Not on file  Tobacco Use  . Smoking status: Never Smoker  . Smokeless tobacco: Never Used  Substance and Sexual Activity  . Alcohol use: Yes  . Drug use: No  . Sexual activity: Not on file  Other Topics Concern  . Not on file  Social History Narrative   Married   Regular exercise- yes   Dog not sick   Mom had hip fracture was caretaker but doing well.   Social Determinants of Health   Financial Resource Strain:   . Difficulty of Paying Living Expenses:   Food Insecurity:   . Worried About Charity fundraiser in the Last Year:   . Arboriculturist in the Last Year:   Transportation Needs:   . Film/video editor (Medical):   Marland Kitchen Lack of Transportation (Non-Medical):   Physical Activity:   . Days of Exercise per Week:   . Minutes of Exercise per Session:   Stress:   . Feeling of Stress :   Social Connections:   . Frequency of Communication with Friends and Family:   . Frequency of Social Gatherings with Friends and Family:   . Attends Religious Services:   . Active Member of Clubs or Organizations:   . Attends Archivist Meetings:   Marland Kitchen Marital Status:   Intimate Partner Violence:   . Fear of Current or Ex-Partner:   . Emotionally Abused:   Marland Kitchen Physically Abused:   . Sexually Abused:       PHYSICAL EXAM  Vitals:   08/29/19 0817  BP: (!) 141/76  Pulse: (!) 59  Weight: 188 lb  (85.3 kg)  Height: '5\' 5"'  (1.651 m)   Body mass index is 31.28 kg/m.  Generalized: Well developed, in no acute distress  Chest: Lungs clear to auscultation bilaterally  Neurological examination  Mentation: Alert oriented to time, place, history taking. Follows all commands speech and language fluent Cranial nerve II-XII: Extraocular movements were full, visual field were full on confrontational test Head turning and shoulder shrug  were normal and symmetric. Motor: The motor  testing reveals 5 over 5 strength of all 4 extremities. Good symmetric motor tone is noted throughout.  Sensory: Sensory testing is intact to soft touch on all 4 extremities. No evidence of extinction is noted.  Gait and station: Gait is normal.    DIAGNOSTIC DATA (LABS, IMAGING, TESTING) - I reviewed patient records, labs, notes, testing and imaging myself where available.  Lab Results  Component Value Date   WBC 4.2 (L) 04/09/2011   HGB 13.6 04/09/2011   HCT 40.0 04/09/2011   MCV 87.2 04/09/2011   PLT 213.0 04/09/2011      Component Value Date/Time   NA 141 10/07/2010 1224   K 4.5 10/07/2010 1224   CL 106 10/07/2010 1224   CO2 26 10/07/2010 1224   GLUCOSE 86 10/07/2010 1224   BUN 15 10/07/2010 1224   CREATININE 0.7 10/07/2010 1224   CALCIUM 9.3 10/07/2010 1224   PROT 7.6 10/07/2010 1224   ALBUMIN 4.3 10/07/2010 1224   AST 34 10/07/2010 1224   ALT 34 10/07/2010 1224   ALKPHOS 97 10/07/2010 1224   BILITOT 0.3 10/07/2010 1224   GFRNONAA >60 07/20/2009 1358   GFRAA  07/20/2009 1358    >60        The eGFR has been calculated using the MDRD equation. This calculation has not been validated in all clinical situations. eGFR's persistently <60 mL/min signify possible Chronic Kidney Disease.   Lab Results  Component Value Date   CHOL 205 (HH) 06/28/2007   HDL 51.6 06/28/2007   LDLCALC 128 (H) 12/07/2006   LDLDIRECT 143.1 06/28/2007   TRIG 114 06/28/2007   CHOLHDL 4.0 CALC 06/28/2007   No  results found for: HGBA1C No results found for: VITAMINB12 Lab Results  Component Value Date   TSH 1.54 03/10/2011      ASSESSMENT AND PLAN 69 y.o. year old female  has a past medical history of DDD (degenerative disc disease), lumbosacral, Depression, HLD (hyperlipidemia), HNP (herniated nucleus pulposus), Osteopenia, Rheumatic fever, and Thyroid disease. here with:  1. OSA on CPAP  - CPAP compliance excellent - Good treatment of AHI  - Encourage patient to use CPAP nightly and > 4 hours each night - F/U in 1 year or sooner if needed   I spent 20 minutes of face-to-face and non-face-to-face time with patient.  This included previsit chart review, lab review, study review, order entry, electronic health record documentation, patient education.  Ward Givens, MSN, NP-C 08/29/2019, 8:54 AM [facility] [address] [place], [place] [address] [phone]  I reviewed the above note and documentation by the Nurse Practitioner and agree with the history, exam, assessment and plan as outlined above. I was available for consultation. Star Age, MD, PhD [facility])

## 2019-08-29 NOTE — Patient Instructions (Addendum)
Continue using CPAP nightly and greater than 4 hours each night If your symptoms worsen or you develop new symptoms please let us know.   Please call [employer] at [phone], extension [phone] to discuss your concerns. Their customer service representatives will be glad to assist you. If they do not answer, please leave a message and they will call you back. Make sure to leave your name and return phone number. If you do not get a response from them in 3 business days, please let our office know.

## 2019-09-07 DIAGNOSIS — H02831 Dermatochalasis of right upper eyelid: Secondary | ICD-10-CM | POA: Diagnosis not present

## 2019-09-07 DIAGNOSIS — H02834 Dermatochalasis of left upper eyelid: Secondary | ICD-10-CM | POA: Diagnosis not present

## 2019-09-07 DIAGNOSIS — H2513 Age-related nuclear cataract, bilateral: Secondary | ICD-10-CM | POA: Diagnosis not present

## 2019-09-07 DIAGNOSIS — Z83511 Family history of glaucoma: Secondary | ICD-10-CM | POA: Diagnosis not present

## 2019-09-07 DIAGNOSIS — H40023 Open angle with borderline findings, high risk, bilateral: Secondary | ICD-10-CM | POA: Diagnosis not present

## 2019-09-07 DIAGNOSIS — H52203 Unspecified astigmatism, bilateral: Secondary | ICD-10-CM | POA: Diagnosis not present

## 2019-09-07 DIAGNOSIS — H43393 Other vitreous opacities, bilateral: Secondary | ICD-10-CM | POA: Diagnosis not present

## 2019-09-07 DIAGNOSIS — H5201 Hypermetropia, right eye: Secondary | ICD-10-CM | POA: Diagnosis not present

## 2019-09-07 DIAGNOSIS — H524 Presbyopia: Secondary | ICD-10-CM | POA: Diagnosis not present

## 2019-09-15 DIAGNOSIS — G4733 Obstructive sleep apnea (adult) (pediatric): Secondary | ICD-10-CM | POA: Diagnosis not present

## 2019-09-25 DIAGNOSIS — L814 Other melanin hyperpigmentation: Secondary | ICD-10-CM | POA: Diagnosis not present

## 2019-09-25 DIAGNOSIS — G4733 Obstructive sleep apnea (adult) (pediatric): Secondary | ICD-10-CM | POA: Diagnosis not present

## 2019-09-25 DIAGNOSIS — I788 Other diseases of capillaries: Secondary | ICD-10-CM | POA: Diagnosis not present

## 2019-09-25 DIAGNOSIS — D225 Melanocytic nevi of trunk: Secondary | ICD-10-CM | POA: Diagnosis not present

## 2019-09-25 DIAGNOSIS — L918 Other hypertrophic disorders of the skin: Secondary | ICD-10-CM | POA: Diagnosis not present

## 2019-09-25 DIAGNOSIS — L821 Other seborrheic keratosis: Secondary | ICD-10-CM | POA: Diagnosis not present

## 2019-10-16 DIAGNOSIS — G4733 Obstructive sleep apnea (adult) (pediatric): Secondary | ICD-10-CM | POA: Diagnosis not present

## 2019-10-24 DIAGNOSIS — E1169 Type 2 diabetes mellitus with other specified complication: Secondary | ICD-10-CM | POA: Diagnosis not present

## 2019-10-24 DIAGNOSIS — M859 Disorder of bone density and structure, unspecified: Secondary | ICD-10-CM | POA: Diagnosis not present

## 2019-10-24 DIAGNOSIS — E039 Hypothyroidism, unspecified: Secondary | ICD-10-CM | POA: Diagnosis not present

## 2019-10-25 DIAGNOSIS — G4733 Obstructive sleep apnea (adult) (pediatric): Secondary | ICD-10-CM | POA: Diagnosis not present

## 2019-10-29 DIAGNOSIS — G4733 Obstructive sleep apnea (adult) (pediatric): Secondary | ICD-10-CM | POA: Diagnosis not present

## 2019-10-29 DIAGNOSIS — E669 Obesity, unspecified: Secondary | ICD-10-CM | POA: Diagnosis not present

## 2019-10-29 DIAGNOSIS — M5136 Other intervertebral disc degeneration, lumbar region: Secondary | ICD-10-CM | POA: Diagnosis not present

## 2019-10-29 DIAGNOSIS — M8580 Other specified disorders of bone density and structure, unspecified site: Secondary | ICD-10-CM | POA: Diagnosis not present

## 2019-10-29 DIAGNOSIS — E1169 Type 2 diabetes mellitus with other specified complication: Secondary | ICD-10-CM | POA: Diagnosis not present

## 2019-10-29 DIAGNOSIS — G4700 Insomnia, unspecified: Secondary | ICD-10-CM | POA: Diagnosis not present

## 2019-10-29 DIAGNOSIS — Z0000 Encounter for general adult medical examination without abnormal findings: Secondary | ICD-10-CM | POA: Diagnosis not present

## 2019-10-29 DIAGNOSIS — I700 Atherosclerosis of aorta: Secondary | ICD-10-CM | POA: Diagnosis not present

## 2019-10-29 DIAGNOSIS — R82998 Other abnormal findings in urine: Secondary | ICD-10-CM | POA: Diagnosis not present

## 2019-10-29 DIAGNOSIS — R1310 Dysphagia, unspecified: Secondary | ICD-10-CM | POA: Diagnosis not present

## 2019-10-29 DIAGNOSIS — E039 Hypothyroidism, unspecified: Secondary | ICD-10-CM | POA: Diagnosis not present

## 2019-11-07 DIAGNOSIS — G4733 Obstructive sleep apnea (adult) (pediatric): Secondary | ICD-10-CM | POA: Diagnosis not present

## 2019-11-09 DIAGNOSIS — Z1212 Encounter for screening for malignant neoplasm of rectum: Secondary | ICD-10-CM | POA: Diagnosis not present

## 2019-11-25 DIAGNOSIS — G4733 Obstructive sleep apnea (adult) (pediatric): Secondary | ICD-10-CM | POA: Diagnosis not present

## 2019-12-08 DIAGNOSIS — G4733 Obstructive sleep apnea (adult) (pediatric): Secondary | ICD-10-CM | POA: Diagnosis not present

## 2019-12-17 DIAGNOSIS — G4733 Obstructive sleep apnea (adult) (pediatric): Secondary | ICD-10-CM | POA: Diagnosis not present

## 2019-12-25 DIAGNOSIS — G4733 Obstructive sleep apnea (adult) (pediatric): Secondary | ICD-10-CM | POA: Diagnosis not present

## 2020-01-07 DIAGNOSIS — G4733 Obstructive sleep apnea (adult) (pediatric): Secondary | ICD-10-CM | POA: Diagnosis not present

## 2020-01-16 DIAGNOSIS — G4733 Obstructive sleep apnea (adult) (pediatric): Secondary | ICD-10-CM | POA: Diagnosis not present

## 2020-01-25 DIAGNOSIS — G4733 Obstructive sleep apnea (adult) (pediatric): Secondary | ICD-10-CM | POA: Diagnosis not present

## 2020-02-16 DIAGNOSIS — G4733 Obstructive sleep apnea (adult) (pediatric): Secondary | ICD-10-CM | POA: Diagnosis not present

## 2020-02-25 DIAGNOSIS — G4733 Obstructive sleep apnea (adult) (pediatric): Secondary | ICD-10-CM | POA: Diagnosis not present

## 2020-02-29 DIAGNOSIS — Z1231 Encounter for screening mammogram for malignant neoplasm of breast: Secondary | ICD-10-CM | POA: Diagnosis not present

## 2020-03-03 ENCOUNTER — Ambulatory Visit: Payer: Medicare HMO | Admitting: Adult Health

## 2020-03-03 ENCOUNTER — Encounter: Payer: Self-pay | Admitting: Adult Health

## 2020-03-03 VITALS — BP 132/77 | HR 63 | Ht 65.0 in | Wt 184.0 lb

## 2020-03-03 DIAGNOSIS — Z9989 Dependence on other enabling machines and devices: Secondary | ICD-10-CM

## 2020-03-03 DIAGNOSIS — G4733 Obstructive sleep apnea (adult) (pediatric): Secondary | ICD-10-CM

## 2020-03-03 NOTE — Patient Instructions (Signed)
Continue using CPAP nightly and greater than 4 hours each night Decrease pressure 7-12 Adjust humidity  If your symptoms worsen or you develop new symptoms please let us know.

## 2020-03-03 NOTE — Progress Notes (Signed)
PATIENT: Taylor Lee DOB: November 28, 1950  REASON FOR VISIT: follow up HISTORY FROM: patient  HISTORY OF PRESENT ILLNESS: Today 03/03/20: Taylor Lee is a 70 year old female with a history of obstructive sleep apnea on CPAP.  She reports that she continues to struggle with using the CPAP.  She does state that she puts it on every night but often during the middle the night she takes it off without her knowing.  She also states that she struggles falling asleep at night.  She does watch television in bed.  Reports that some nights she does not sleep greater than 4 hours.  Her download is below.  Compliance Report Usage 02/02/2020 - 03/02/2020 Usage days 28/30 days (93%) >= 4 hours 15 days (50%) Average usage (days used) 4 hours 2 minutes  AirSense 10 AutoSet Serial number [id number] Mode AutoSet Min Pressure 7 cmH2O Max Pressure 13 cmH2O EPR Fulltime EPR level 1 Response Standard   Therapy Pressure - cmH2O Median: 7.9 95th percentile: 9.9 Maximum: 10.7 Leaks - L/min Median: 2.1 95th percentile: 18.7 Maximum: 27.0 Events per hour AI: 0.9 HI: 0.1 AHI: 1.0 Apnea Index Central: 0.2 Obstructive: 0.4 Unknown: 0.4  08/29/19: Taylor Lee is a 70 year old female with a history of obstructive sleep apnea on CPAP.  Her download indicates that she use her machine 29 out of 30 days for compliance of 97%.  She use her machine greater then 4 hours 27 out of 30 days for compliance of 90%.  On average she uses her machine 5 hours and 40 minutes.  Her residual AHI is 0.5 on 7 to 13 cm of water with EPR of 1.  Leak in the 95th percentile is 23.9 L/min.  HISTORY (copied from Dr. Guadelupe Sabin note)  Taylor Lee is a 70 year old right-handed woman with an underlying medical history of chronic low back pain, depression, anxiety, diabetes, hypothyroidism, osteopenia, hyperlipidemia, and borderline obesity, who reports loud snoring for years and some excessive daytime somnolence.  I reviewed your  telemedicine note from 04/16/2019.  Her Epworth sleepiness score is 12/24, fatigue severity score is 17 out of 63.  She reports that she had been using a snore guard which was custom made by her dentist for years, it had helped her snoring quite a bit but has resulted and shifting of her teeth and malocclusion.  She has recently seen an orthodontist and may need braces or a retainer to realign her teeth but can no longer use her mouthguard done.  She has woken up with a sense of gasping from time to time, she has never had a sleep study.  She has no family history of sleep apnea but her father died young at 63 from heart disease and her paternal uncle also died at 46 from heart disease.  She has 2 grown children.  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  She lives with her husband who snores loudly and they currently sleep in different bedrooms because of his recent surgery.  She has a TV in her bedroom and it tends to be on at night, sometimes all night.  She has chronic difficulty initiating and maintaining sleep, primarily going to sleep.  She may get only 4 or 5 hours of sleep on any given night.  She does not have night to night nocturia or recurrent morning headaches and denies any telltale symptoms of restless leg syndrome although her mother had severe RLS she reports.  Patient is a non-smoker,  she drinks alcohol rarely, caffeine and limitation in the form of coffee, 2 cups/day on average.  They have no pets in the household.  She has been working on weight loss and altogether lost about 60 pounds, gained about 20 pounds back.  Bedtime varies quite a bit, she may not go to bed until early morning, rise time is typically fairly consistent and around 7 AM.  REVIEW OF SYSTEMS: Out of a complete 14 system review of symptoms, the patient complains only of the following symptoms, and all other reviewed systems are negative.  FSS 11 ESS 5  ALLERGIES: Allergies  Allergen  Reactions  . Prednisone Swelling  . Levothyroxine     Generics only cause dry mouth and not sleeping. Pt must have brand on this medication.  . Propoxyphene N-Acetaminophen     REACTION: unspecified    HOME MEDICATIONS: Outpatient Medications Prior to Visit  Medication Sig Dispense Refill  . HYDROcodone-acetaminophen (NORCO/VICODIN) 5-325 MG tablet Take 1 tablet by mouth 2 (two) times daily as needed for moderate pain.    Marland Kitchen liothyronine (CYTOMEL) 5 MCG tablet Take 5-10 mcg by mouth in the morning and at bedtime. 10 mg in am and 1 tablet in evening    . pantoprazole (PROTONIX) 40 MG tablet     . SYNTHROID 75 MCG tablet TAKE 1 TABLET BY MOUTH DAILY 30 tablet 1   No facility-administered medications prior to visit.    PAST MEDICAL HISTORY: Past Medical History:  Diagnosis Date  . DDD (degenerative disc disease), lumbosacral   . Depression   . HLD (hyperlipidemia)   . HNP (herniated nucleus pulposus)    I5-S1  . Osteopenia   . Rheumatic fever   . Thyroid disease     PAST SURGICAL HISTORY: Past Surgical History:  Procedure Laterality Date  . CESAREAN SECTION    . NO PAST SURGERIES    . UMBILICAL HERNIA REPAIR  2016    FAMILY HISTORY: Family History  Problem Relation Age of Onset  . Hip fracture Mother   . Heart disease Mother   . Sudden death Other   . Heart disease Other   . Heart attack Father     SOCIAL HISTORY: Social History   Socioeconomic History  . Marital status: Married    Spouse name: Not on file  . Number of children: Not on file  . Years of education: Not on file  . Highest education level: Not on file  Occupational History  . Not on file  Tobacco Use  . Smoking status: Never Smoker  . Smokeless tobacco: Never Used  Substance and Sexual Activity  . Alcohol use: Yes  . Drug use: No  . Sexual activity: Not on file  Other Topics Concern  . Not on file  Social History Narrative   Married   Regular exercise- yes   Dog not sick   Mom had hip  fracture was caretaker but doing well.   Social Determinants of Health   Financial Resource Strain: Not on file  Food Insecurity: Not on file  Transportation Needs: Not on file  Physical Activity: Not on file  Stress: Not on file  Social Connections: Not on file  Intimate Partner Violence: Not on file      PHYSICAL EXAM  Vitals:   03/03/20 0835  BP: (!) 150/78  Pulse: 63  Weight: 184 lb (83.5 kg)  Height: _0  (1.651 m)   Body mass index is 30.62 kg/m.  Generalized: Well developed, in no  acute distress  Chest: Lungs clear to auscultation bilaterally  Neurological examination  Mentation: Alert oriented to time, place, history taking. Follows all commands speech and language fluent Cranial nerve II-XII: Extraocular movements were full, visual field were full on confrontational test Head turning and shoulder shrug  were normal and symmetric. Motor: The motor testing reveals 5 over 5 strength of all 4 extremities. Good symmetric motor tone is noted throughout.  Sensory: Sensory testing is intact to soft touch on all 4 extremities. No evidence of extinction is noted.  Gait and station: Gait is normal.    DIAGNOSTIC DATA (LABS, IMAGING, TESTING) - I reviewed patient records, labs, notes, testing and imaging myself where available.  Lab Results  Component Value Date   WBC 4.2 (L) 04/09/2011   HGB 13.6 04/09/2011   HCT 40.0 04/09/2011   MCV 87.2 04/09/2011   PLT 213.0 04/09/2011      Component Value Date/Time   NA 141 10/07/2010 1224   K 4.5 10/07/2010 1224   CL 106 10/07/2010 1224   CO2 26 10/07/2010 1224   GLUCOSE 86 10/07/2010 1224   BUN 15 10/07/2010 1224   CREATININE 0.7 10/07/2010 1224   CALCIUM 9.3 10/07/2010 1224   PROT 7.6 10/07/2010 1224   ALBUMIN 4.3 10/07/2010 1224   AST 34 10/07/2010 1224   ALT 34 10/07/2010 1224   ALKPHOS 97 10/07/2010 1224   BILITOT 0.3 10/07/2010 1224   GFRNONAA >60 07/20/2009 1358   GFRAA  07/20/2009 1358    >60        The  eGFR has been calculated using the MDRD equation. This calculation has not been validated in all clinical situations. eGFR's persistently <60 mL/min signify possible Chronic Kidney Disease.   Lab Results  Component Value Date   CHOL 205 (HH) 06/28/2007   HDL 51.6 06/28/2007   LDLCALC 128 (H) 12/07/2006   LDLDIRECT 143.1 06/28/2007   TRIG 114 06/28/2007   CHOLHDL 4.0 CALC 06/28/2007   No results found for: HGBA1C No results found for: VITAMINB12 Lab Results  Component Value Date   TSH 1.54 03/10/2011      ASSESSMENT AND PLAN 70 y.o. year old female  has a past medical history of DDD (degenerative disc disease), lumbosacral, Depression, HLD (hyperlipidemia), HNP (herniated nucleus pulposus), Osteopenia, Rheumatic fever, and Thyroid disease. here with:  1. OSA on CPAP  - CPAP compliance suboptimal--we discussed inspire device as this may be a better option for her since she has trouble with using the CPAP. -I will decrease her pressure 7-12 to see if she finds this more comfortable.  Encouraged her to increase her humidity level. - Good treatment of AHI  - Encourage patient to use CPAP nightly and > 4 hours each night - F/U in 1 year or sooner if needed   I spent 30 minutes of face-to-face and non-face-to-face time with patient.  This included previsit chart review, lab review, study review, order entry, electronic health record documentation, patient education.  Ward Givens, MSN, NP-C 03/03/2020, 8:30 AM [facility] [address], [place], [place] [address] [phone]

## 2020-03-10 NOTE — Progress Notes (Signed)
Cm sent to [facility]

## 2020-03-11 DIAGNOSIS — H02831 Dermatochalasis of right upper eyelid: Secondary | ICD-10-CM | POA: Diagnosis not present

## 2020-03-11 DIAGNOSIS — H02834 Dermatochalasis of left upper eyelid: Secondary | ICD-10-CM | POA: Diagnosis not present

## 2020-03-11 DIAGNOSIS — Z83511 Family history of glaucoma: Secondary | ICD-10-CM | POA: Diagnosis not present

## 2020-03-11 DIAGNOSIS — H40023 Open angle with borderline findings, high risk, bilateral: Secondary | ICD-10-CM | POA: Diagnosis not present

## 2020-03-13 DIAGNOSIS — G4733 Obstructive sleep apnea (adult) (pediatric): Secondary | ICD-10-CM | POA: Diagnosis not present

## 2020-03-24 DIAGNOSIS — G4733 Obstructive sleep apnea (adult) (pediatric): Secondary | ICD-10-CM | POA: Diagnosis not present

## 2020-03-25 DIAGNOSIS — L918 Other hypertrophic disorders of the skin: Secondary | ICD-10-CM | POA: Diagnosis not present

## 2020-03-25 DIAGNOSIS — I788 Other diseases of capillaries: Secondary | ICD-10-CM | POA: Diagnosis not present

## 2020-03-25 DIAGNOSIS — D485 Neoplasm of uncertain behavior of skin: Secondary | ICD-10-CM | POA: Diagnosis not present

## 2020-03-25 DIAGNOSIS — D225 Melanocytic nevi of trunk: Secondary | ICD-10-CM | POA: Diagnosis not present

## 2020-03-25 DIAGNOSIS — L821 Other seborrheic keratosis: Secondary | ICD-10-CM | POA: Diagnosis not present

## 2020-03-25 DIAGNOSIS — D1801 Hemangioma of skin and subcutaneous tissue: Secondary | ICD-10-CM | POA: Diagnosis not present

## 2020-03-25 DIAGNOSIS — L814 Other melanin hyperpigmentation: Secondary | ICD-10-CM | POA: Diagnosis not present

## 2020-04-10 DIAGNOSIS — G4733 Obstructive sleep apnea (adult) (pediatric): Secondary | ICD-10-CM | POA: Diagnosis not present

## 2020-05-11 DIAGNOSIS — G4733 Obstructive sleep apnea (adult) (pediatric): Secondary | ICD-10-CM | POA: Diagnosis not present

## 2020-06-06 DIAGNOSIS — G4733 Obstructive sleep apnea (adult) (pediatric): Secondary | ICD-10-CM | POA: Diagnosis not present

## 2020-06-10 DIAGNOSIS — G4733 Obstructive sleep apnea (adult) (pediatric): Secondary | ICD-10-CM | POA: Diagnosis not present

## 2020-07-07 DIAGNOSIS — G4733 Obstructive sleep apnea (adult) (pediatric): Secondary | ICD-10-CM | POA: Diagnosis not present

## 2020-07-17 DIAGNOSIS — G4733 Obstructive sleep apnea (adult) (pediatric): Secondary | ICD-10-CM | POA: Diagnosis not present

## 2020-08-06 DIAGNOSIS — G4733 Obstructive sleep apnea (adult) (pediatric): Secondary | ICD-10-CM | POA: Diagnosis not present

## 2020-08-27 DIAGNOSIS — L304 Erythema intertrigo: Secondary | ICD-10-CM | POA: Diagnosis not present

## 2020-08-29 DIAGNOSIS — G4733 Obstructive sleep apnea (adult) (pediatric): Secondary | ICD-10-CM | POA: Diagnosis not present

## 2020-09-08 DIAGNOSIS — H02834 Dermatochalasis of left upper eyelid: Secondary | ICD-10-CM | POA: Diagnosis not present

## 2020-09-08 DIAGNOSIS — Z0100 Encounter for examination of eyes and vision without abnormal findings: Secondary | ICD-10-CM | POA: Diagnosis not present

## 2020-09-08 DIAGNOSIS — H5213 Myopia, bilateral: Secondary | ICD-10-CM | POA: Diagnosis not present

## 2020-09-08 DIAGNOSIS — H2513 Age-related nuclear cataract, bilateral: Secondary | ICD-10-CM | POA: Diagnosis not present

## 2020-09-08 DIAGNOSIS — H40023 Open angle with borderline findings, high risk, bilateral: Secondary | ICD-10-CM | POA: Diagnosis not present

## 2020-09-08 DIAGNOSIS — H52203 Unspecified astigmatism, bilateral: Secondary | ICD-10-CM | POA: Diagnosis not present

## 2020-09-08 DIAGNOSIS — R7303 Prediabetes: Secondary | ICD-10-CM | POA: Diagnosis not present

## 2020-09-08 DIAGNOSIS — H5203 Hypermetropia, bilateral: Secondary | ICD-10-CM | POA: Diagnosis not present

## 2020-09-08 DIAGNOSIS — H524 Presbyopia: Secondary | ICD-10-CM | POA: Diagnosis not present

## 2020-09-08 DIAGNOSIS — Z83511 Family history of glaucoma: Secondary | ICD-10-CM | POA: Diagnosis not present

## 2020-09-08 DIAGNOSIS — H43393 Other vitreous opacities, bilateral: Secondary | ICD-10-CM | POA: Diagnosis not present

## 2020-09-08 DIAGNOSIS — H02831 Dermatochalasis of right upper eyelid: Secondary | ICD-10-CM | POA: Diagnosis not present

## 2020-09-11 DIAGNOSIS — L308 Other specified dermatitis: Secondary | ICD-10-CM | POA: Diagnosis not present

## 2020-09-29 DIAGNOSIS — G4733 Obstructive sleep apnea (adult) (pediatric): Secondary | ICD-10-CM | POA: Diagnosis not present

## 2020-09-30 DIAGNOSIS — D1801 Hemangioma of skin and subcutaneous tissue: Secondary | ICD-10-CM | POA: Diagnosis not present

## 2020-09-30 DIAGNOSIS — L918 Other hypertrophic disorders of the skin: Secondary | ICD-10-CM | POA: Diagnosis not present

## 2020-09-30 DIAGNOSIS — I788 Other diseases of capillaries: Secondary | ICD-10-CM | POA: Diagnosis not present

## 2020-09-30 DIAGNOSIS — L304 Erythema intertrigo: Secondary | ICD-10-CM | POA: Diagnosis not present

## 2020-09-30 DIAGNOSIS — D225 Melanocytic nevi of trunk: Secondary | ICD-10-CM | POA: Diagnosis not present

## 2020-09-30 DIAGNOSIS — L821 Other seborrheic keratosis: Secondary | ICD-10-CM | POA: Diagnosis not present

## 2020-09-30 DIAGNOSIS — L814 Other melanin hyperpigmentation: Secondary | ICD-10-CM | POA: Diagnosis not present

## 2020-11-21 DIAGNOSIS — G4733 Obstructive sleep apnea (adult) (pediatric): Secondary | ICD-10-CM | POA: Diagnosis not present

## 2020-11-25 DIAGNOSIS — E039 Hypothyroidism, unspecified: Secondary | ICD-10-CM | POA: Diagnosis not present

## 2020-11-25 DIAGNOSIS — M859 Disorder of bone density and structure, unspecified: Secondary | ICD-10-CM | POA: Diagnosis not present

## 2020-11-25 DIAGNOSIS — E1169 Type 2 diabetes mellitus with other specified complication: Secondary | ICD-10-CM | POA: Diagnosis not present

## 2020-12-02 DIAGNOSIS — Z23 Encounter for immunization: Secondary | ICD-10-CM | POA: Diagnosis not present

## 2020-12-02 DIAGNOSIS — E669 Obesity, unspecified: Secondary | ICD-10-CM | POA: Diagnosis not present

## 2020-12-02 DIAGNOSIS — I700 Atherosclerosis of aorta: Secondary | ICD-10-CM | POA: Diagnosis not present

## 2020-12-02 DIAGNOSIS — M8580 Other specified disorders of bone density and structure, unspecified site: Secondary | ICD-10-CM | POA: Diagnosis not present

## 2020-12-02 DIAGNOSIS — Z1212 Encounter for screening for malignant neoplasm of rectum: Secondary | ICD-10-CM | POA: Diagnosis not present

## 2020-12-02 DIAGNOSIS — Z1331 Encounter for screening for depression: Secondary | ICD-10-CM | POA: Diagnosis not present

## 2020-12-02 DIAGNOSIS — E1169 Type 2 diabetes mellitus with other specified complication: Secondary | ICD-10-CM | POA: Diagnosis not present

## 2020-12-02 DIAGNOSIS — R82998 Other abnormal findings in urine: Secondary | ICD-10-CM | POA: Diagnosis not present

## 2020-12-02 DIAGNOSIS — Z1389 Encounter for screening for other disorder: Secondary | ICD-10-CM | POA: Diagnosis not present

## 2020-12-02 DIAGNOSIS — K429 Umbilical hernia without obstruction or gangrene: Secondary | ICD-10-CM | POA: Diagnosis not present

## 2020-12-02 DIAGNOSIS — Z0000 Encounter for general adult medical examination without abnormal findings: Secondary | ICD-10-CM | POA: Diagnosis not present

## 2020-12-02 DIAGNOSIS — G4733 Obstructive sleep apnea (adult) (pediatric): Secondary | ICD-10-CM | POA: Diagnosis not present

## 2020-12-02 DIAGNOSIS — E039 Hypothyroidism, unspecified: Secondary | ICD-10-CM | POA: Diagnosis not present

## 2021-02-09 DIAGNOSIS — M8589 Other specified disorders of bone density and structure, multiple sites: Secondary | ICD-10-CM | POA: Diagnosis not present

## 2021-02-13 DIAGNOSIS — G4733 Obstructive sleep apnea (adult) (pediatric): Secondary | ICD-10-CM | POA: Diagnosis not present

## 2021-03-03 ENCOUNTER — Ambulatory Visit: Payer: Medicare HMO | Admitting: Adult Health

## 2021-03-16 DIAGNOSIS — G4733 Obstructive sleep apnea (adult) (pediatric): Secondary | ICD-10-CM | POA: Diagnosis not present

## 2021-03-17 DIAGNOSIS — Z1231 Encounter for screening mammogram for malignant neoplasm of breast: Secondary | ICD-10-CM | POA: Diagnosis not present

## 2021-03-19 DIAGNOSIS — Z1211 Encounter for screening for malignant neoplasm of colon: Secondary | ICD-10-CM | POA: Diagnosis not present

## 2021-04-14 DIAGNOSIS — L814 Other melanin hyperpigmentation: Secondary | ICD-10-CM | POA: Diagnosis not present

## 2021-04-14 DIAGNOSIS — I788 Other diseases of capillaries: Secondary | ICD-10-CM | POA: Diagnosis not present

## 2021-04-14 DIAGNOSIS — D2272 Melanocytic nevi of left lower limb, including hip: Secondary | ICD-10-CM | POA: Diagnosis not present

## 2021-04-14 DIAGNOSIS — D225 Melanocytic nevi of trunk: Secondary | ICD-10-CM | POA: Diagnosis not present

## 2021-04-14 DIAGNOSIS — D2271 Melanocytic nevi of right lower limb, including hip: Secondary | ICD-10-CM | POA: Diagnosis not present

## 2021-04-14 DIAGNOSIS — L918 Other hypertrophic disorders of the skin: Secondary | ICD-10-CM | POA: Diagnosis not present

## 2021-04-14 DIAGNOSIS — L821 Other seborrheic keratosis: Secondary | ICD-10-CM | POA: Diagnosis not present

## 2021-04-30 DIAGNOSIS — H25042 Posterior subcapsular polar age-related cataract, left eye: Secondary | ICD-10-CM | POA: Diagnosis not present

## 2021-04-30 DIAGNOSIS — H52203 Unspecified astigmatism, bilateral: Secondary | ICD-10-CM | POA: Diagnosis not present

## 2021-04-30 DIAGNOSIS — H524 Presbyopia: Secondary | ICD-10-CM | POA: Diagnosis not present

## 2021-04-30 DIAGNOSIS — H2513 Age-related nuclear cataract, bilateral: Secondary | ICD-10-CM | POA: Diagnosis not present

## 2021-04-30 DIAGNOSIS — H5203 Hypermetropia, bilateral: Secondary | ICD-10-CM | POA: Diagnosis not present

## 2021-05-06 ENCOUNTER — Ambulatory Visit: Payer: Medicare HMO | Admitting: Adult Health

## 2021-05-06 ENCOUNTER — Telehealth: Payer: Self-pay | Admitting: Adult Health

## 2021-05-06 NOTE — Telephone Encounter (Signed)
Contact pt to reschedule appt for today due to provider being out. Reschedule appt as a VV as the request of the provider on 05/26/21 at 3 pm. ?

## 2021-05-14 DIAGNOSIS — G4733 Obstructive sleep apnea (adult) (pediatric): Secondary | ICD-10-CM | POA: Diagnosis not present

## 2021-05-26 ENCOUNTER — Telehealth (INDEPENDENT_AMBULATORY_CARE_PROVIDER_SITE_OTHER): Payer: Medicare HMO | Admitting: Adult Health

## 2021-05-26 DIAGNOSIS — Z9989 Dependence on other enabling machines and devices: Secondary | ICD-10-CM

## 2021-05-26 DIAGNOSIS — G4733 Obstructive sleep apnea (adult) (pediatric): Secondary | ICD-10-CM

## 2021-05-26 NOTE — Progress Notes (Signed)
Called [facility]) was not able to get the information on her travel cpap (they say they do not have this machine on her account).  Pt states she has taken it over there multiple times.  She will take it over there tomorrow if she does not hear from me. I sent email message to [facility] and will see what she says.  ?

## 2021-05-26 NOTE — Progress Notes (Signed)
? ? ? ?PATIENT: Taylor Lee ?DOB: 1951/01/05 ? ?REASON FOR VISIT: follow up ?HISTORY FROM: patient ?PRIMARY NEUROLOGIST:  ? ?Virtual Visit via Video Note ? ?I connected with Taylor Lee on 05/26/21 at  3:00 PM EDT by a video enabled telemedicine application located remotely at [facility] and verified that I am speaking with the correct person using two identifiers who was located at their own home. ?  ?I discussed the limitations of evaluation and management by telemedicine and the availability of in person appointments. The patient expressed understanding and agreed to proceed. ? ? ?PATIENT: Taylor Lee ?DOB: September 30, 1950 ? ?REASON FOR VISIT: follow up ?HISTORY FROM: patient ? ?HISTORY OF PRESENT ILLNESS: ?Today 05/26/21 ?Ms. Hollman is a 71 year old female with a history of obstructive sleep apnea on CPAP.  She returns today for virtual visit. ?Patient has been struggling with CPAP. Hasn't used in two months but she did just get a new mask to retry CPAP. Has a travel CPAP and reports the the pressure is too strong.  I am not sure what the pressure is ? ? ? ?REVIEW F SYSTEMS: Out of a complete 14 system review of symptoms, the patient complains only of the following symptoms, and all other reviewed systems are negative. ? ?ALLERGIES: ?Allergies  ?Allergen Reactions  ? Prednisone Swelling  ? Levothyroxine   ?  Generics only cause dry mouth and not sleeping. Pt must have brand on this medication.  ? Propoxyphene N-Acetaminophen   ?  REACTION: unspecified  ? ? ?HOME MEDICATIONS: ?Outpatient Medications Prior to Visit  ?Medication Sig Dispense Refill  ? HYDROcodone-acetaminophen (NORCO/VICODIN) 5-325 MG tablet Take 1 tablet by mouth 2 (two) times daily as needed for moderate pain.    ? liothyronine (CYTOMEL) 5 MCG tablet Take 5-10 mcg by mouth in the morning and at bedtime. 10 mg in am and 1 tablet in evening    ? pantoprazole (PROTONIX) 40 MG tablet     ? SYNTHROID 75 MCG tablet  TAKE 1 TABLET BY MOUTH DAILY 30 tablet 1  ? ?No facility-administered medications prior to visit.  ? ? ?PAST MEDICAL HISTORY: ?Past Medical History:  ?Diagnosis Date  ? DDD (degenerative disc disease), lumbosacral   ? Depression   ? HLD (hyperlipidemia)   ? HNP (herniated nucleus pulposus)   ? I5-S1  ? Osteopenia   ? Rheumatic fever   ? Thyroid disease   ? ? ?PAST SURGICAL HISTORY: ?Past Surgical History:  ?Procedure Laterality Date  ? CESAREAN SECTION    ? NO PAST SURGERIES    ? UMBILICAL HERNIA REPAIR  2016  ? ? ?FAMILY HISTORY: ?Family History  ?Problem Relation Age of Onset  ? Hip fracture Mother   ? Heart disease Mother   ? Sudden death Other   ? Heart disease Other   ? Heart attack Father   ? ? ?SOCIAL HISTORY: ?Social History  ? ?Socioeconomic History  ? Marital status: Married  ?  Spouse name: Not on file  ? Number of children: Not on file  ? Years of education: Not on file  ? Highest education level: Not on file  ?Occupational History  ? Not on file  ?Tobacco Use  ? Smoking status: Never  ? Smokeless tobacco: Never  ?Substance and Sexual Activity  ? Alcohol use: Yes  ? Drug use: No  ? Sexual activity: Not on file  ?Other Topics Concern  ? Not on file  ?Social History Narrative  ? Married  ?  Regular exercise- yes  ? Dog not sick  ? Mom had hip fracture was caretaker but doing well.  ? ?Social Determinants of Health  ? ?Financial Resource Strain: Not on file  ?Food Insecurity: Not on file  ?Transportation Needs: Not on file  ?Physical Activity: Not on file  ?Stress: Not on file  ?Social Connections: Not on file  ?Intimate Partner Violence: Not on file  ? ? ? ? ?PHYSICAL EXAM ?Generalized: Well developed, in no acute distress  ? ?Neurological examination  ?Mentation: Alert oriented to time, place, history taking. Follows all commands speech and language fluent ?Cranial nerve II-XII:Extraocular movements were full. Facial symmetry noted.  Head turning and shoulder shrug  were normal and  symmetric. ? ? ?DIAGNOSTIC DATA (LABS, IMAGING, TESTING) ?- I reviewed patient records, labs, notes, testing and imaging myself where available. ? ?Lab Results  ?Component Value Date  ? WBC 4.2 (L) 04/09/2011  ? HGB 13.6 04/09/2011  ? HCT 40.0 04/09/2011  ? MCV 87.2 04/09/2011  ? PLT 213.0 04/09/2011  ? ?   ?Component Value Date/Time  ? NA 141 10/07/2010 1224  ? K 4.5 10/07/2010 1224  ? CL 106 10/07/2010 1224  ? CO2 26 10/07/2010 1224  ? GLUCOSE 86 10/07/2010 1224  ? BUN 15 10/07/2010 1224  ? CREATININE 0.7 10/07/2010 1224  ? CALCIUM 9.3 10/07/2010 1224  ? PROT 7.6 10/07/2010 1224  ? ALBUMIN 4.3 10/07/2010 1224  ? AST 34 10/07/2010 1224  ? ALT 34 10/07/2010 1224  ? ALKPHOS 97 10/07/2010 1224  ? BILITOT 0.3 10/07/2010 1224  ? GFRNONAA >60 07/20/2009 1358  ? GFRAA  07/20/2009 1358  ?  >60        ?The eGFR has been calculated ?using the MDRD equation. ?This calculation has not been ?validated in all clinical ?situations. ?eGFR's persistently ?<60 mL/min signify ?possible Chronic Kidney Disease.  ? ?Lab Results  ?Component Value Date  ? CHOL 205 (HH) 06/28/2007  ? HDL 51.6 06/28/2007  ? LDLCALC 128 (H) 12/07/2006  ? LDLDIRECT 143.1 06/28/2007  ? TRIG 114 06/28/2007  ? CHOLHDL 4.0 CALC 06/28/2007  ? ? ?Lab Results  ?Component Value Date  ? TSH 1.54 03/10/2011  ? ? ? ? ?ASSESSMENT AND PLAN ?71 y.o. year old female  has a past medical history of DDD (degenerative disc disease), lumbosacral, Depression, HLD (hyperlipidemia), HNP (herniated nucleus pulposus), Osteopenia, Rheumatic fever, and Thyroid disease. here with: ? ?OSA on CPAP ? ?Patient plans to restart CPAP ?She has a new mask ?Encouraged patient to continue using CPAP nightly and > 4 hours each night ?Discussed inspire device however she is not sure she wants to pursue this ?We will reach out to her DME company to find out the pressure of her travel CPAP and we will lower it ?F/U in 1 year or sooner if needed ? ? ? ?Ward Givens, MSN, NP-C 05/26/2021, 3:05  PM ?[facility] ?[address], [address] ?[place], [place] [address] ?([phone] ? ?

## 2021-05-27 ENCOUNTER — Telehealth: Payer: Self-pay | Admitting: *Deleted

## 2021-05-27 NOTE — Telephone Encounter (Signed)
Addended Notes from ofv visit (copied to this note) ? ? Progress Notes by Brandon Melnick, Lee at 05/26/2021 3:00 PM ? ?Author: Brandon Melnick, Lee Author Type: Registered Nurse Filed: 05/26/2021  4:24 PM ?Note Status: Signed Cosign: Cosign Not Required Encounter Date: 05/26/2021 ?Editor: Brandon Melnick, Lee (Registered Nurse)     ?      ?Called [facility] ([employer]) was not able to get the information on her travel cpap (they say they do not have this machine on her account).  Pt states she has taken it over there multiple times.  She will take it over there tomorrow if she does not hear from me. I sent email message to [facility] and will see what she says.  ? ?[id number]:  I called pt back and asked her if she had made call or taken her travel cpap machine to [facility] to find out about what pressure it is set on.  I can find no record from [employer] that they gave this to pt.  Pt states that is very strange.  She will speak to them next week, as will be going OOT the weekend.  ? ? ? ?Taylor Amass, Lee; Vanessa Ralphs ?I dont see where she ever got a travel with [place].   ? ?  ?Previous Messages ?  ?----- Message -----  ?From: Brandon Melnick, Lee  ?Sent: 05/26/2021   4:20 PM EDT  ?To: Ocie Bob  ? ?Pt has an travel cpap machne and we are trying to find out what her pressures are on this machine.    ? ?Taylor Lee  ?Female, 71 y.o., 1950/06/11  ?MRN:  ?[id number]  ?Are you able to help me out.    ? ?I did call [facility] location and they only had her autopap listed.  7-13cm  ? ?Im looking for travel cpap, she has taken it over to there office and had adjusted she says.??  ? ?Thank you  ? ?Taylor Lee   ? ? ? ? ? ? ? ?

## 2021-06-13 DIAGNOSIS — G4733 Obstructive sleep apnea (adult) (pediatric): Secondary | ICD-10-CM | POA: Diagnosis not present

## 2021-07-14 DIAGNOSIS — G4733 Obstructive sleep apnea (adult) (pediatric): Secondary | ICD-10-CM | POA: Diagnosis not present

## 2021-08-05 DIAGNOSIS — K2970 Gastritis, unspecified, without bleeding: Secondary | ICD-10-CM | POA: Diagnosis not present

## 2021-08-05 DIAGNOSIS — R1032 Left lower quadrant pain: Secondary | ICD-10-CM | POA: Diagnosis not present

## 2021-08-21 DIAGNOSIS — G4733 Obstructive sleep apnea (adult) (pediatric): Secondary | ICD-10-CM | POA: Diagnosis not present

## 2021-10-08 DIAGNOSIS — L290 Pruritus ani: Secondary | ICD-10-CM | POA: Diagnosis not present

## 2021-10-08 DIAGNOSIS — M5136 Other intervertebral disc degeneration, lumbar region: Secondary | ICD-10-CM | POA: Diagnosis not present

## 2021-10-08 DIAGNOSIS — M8580 Other specified disorders of bone density and structure, unspecified site: Secondary | ICD-10-CM | POA: Diagnosis not present

## 2021-10-12 DIAGNOSIS — Z0184 Encounter for antibody response examination: Secondary | ICD-10-CM | POA: Diagnosis not present

## 2021-11-16 DIAGNOSIS — H524 Presbyopia: Secondary | ICD-10-CM | POA: Diagnosis not present

## 2021-11-16 DIAGNOSIS — H2513 Age-related nuclear cataract, bilateral: Secondary | ICD-10-CM | POA: Diagnosis not present

## 2021-11-16 DIAGNOSIS — H25042 Posterior subcapsular polar age-related cataract, left eye: Secondary | ICD-10-CM | POA: Diagnosis not present

## 2021-11-16 DIAGNOSIS — H43393 Other vitreous opacities, bilateral: Secondary | ICD-10-CM | POA: Diagnosis not present

## 2021-11-16 DIAGNOSIS — H02834 Dermatochalasis of left upper eyelid: Secondary | ICD-10-CM | POA: Diagnosis not present

## 2021-11-16 DIAGNOSIS — H02831 Dermatochalasis of right upper eyelid: Secondary | ICD-10-CM | POA: Diagnosis not present

## 2021-11-16 DIAGNOSIS — Z83511 Family history of glaucoma: Secondary | ICD-10-CM | POA: Diagnosis not present

## 2021-11-16 DIAGNOSIS — H40023 Open angle with borderline findings, high risk, bilateral: Secondary | ICD-10-CM | POA: Diagnosis not present

## 2021-11-16 DIAGNOSIS — H52203 Unspecified astigmatism, bilateral: Secondary | ICD-10-CM | POA: Diagnosis not present

## 2021-11-16 DIAGNOSIS — R7303 Prediabetes: Secondary | ICD-10-CM | POA: Diagnosis not present

## 2021-12-23 DIAGNOSIS — M8580 Other specified disorders of bone density and structure, unspecified site: Secondary | ICD-10-CM | POA: Diagnosis not present

## 2021-12-23 DIAGNOSIS — M859 Disorder of bone density and structure, unspecified: Secondary | ICD-10-CM | POA: Diagnosis not present

## 2021-12-23 DIAGNOSIS — E039 Hypothyroidism, unspecified: Secondary | ICD-10-CM | POA: Diagnosis not present

## 2021-12-23 DIAGNOSIS — I700 Atherosclerosis of aorta: Secondary | ICD-10-CM | POA: Diagnosis not present

## 2021-12-23 DIAGNOSIS — E1169 Type 2 diabetes mellitus with other specified complication: Secondary | ICD-10-CM | POA: Diagnosis not present

## 2022-01-01 DIAGNOSIS — M5136 Other intervertebral disc degeneration, lumbar region: Secondary | ICD-10-CM | POA: Diagnosis not present

## 2022-01-01 DIAGNOSIS — I700 Atherosclerosis of aorta: Secondary | ICD-10-CM | POA: Diagnosis not present

## 2022-01-01 DIAGNOSIS — Z1339 Encounter for screening examination for other mental health and behavioral disorders: Secondary | ICD-10-CM | POA: Diagnosis not present

## 2022-01-01 DIAGNOSIS — E039 Hypothyroidism, unspecified: Secondary | ICD-10-CM | POA: Diagnosis not present

## 2022-01-01 DIAGNOSIS — E669 Obesity, unspecified: Secondary | ICD-10-CM | POA: Diagnosis not present

## 2022-01-01 DIAGNOSIS — E7870 Disorder of bile acid and cholesterol metabolism, unspecified: Secondary | ICD-10-CM | POA: Diagnosis not present

## 2022-01-01 DIAGNOSIS — G4733 Obstructive sleep apnea (adult) (pediatric): Secondary | ICD-10-CM | POA: Diagnosis not present

## 2022-01-01 DIAGNOSIS — M8580 Other specified disorders of bone density and structure, unspecified site: Secondary | ICD-10-CM | POA: Diagnosis not present

## 2022-01-01 DIAGNOSIS — E1169 Type 2 diabetes mellitus with other specified complication: Secondary | ICD-10-CM | POA: Diagnosis not present

## 2022-01-01 DIAGNOSIS — Z1331 Encounter for screening for depression: Secondary | ICD-10-CM | POA: Diagnosis not present

## 2022-01-01 DIAGNOSIS — R82998 Other abnormal findings in urine: Secondary | ICD-10-CM | POA: Diagnosis not present

## 2022-01-01 DIAGNOSIS — Z0000 Encounter for general adult medical examination without abnormal findings: Secondary | ICD-10-CM | POA: Diagnosis not present

## 2022-01-27 DIAGNOSIS — R002 Palpitations: Secondary | ICD-10-CM | POA: Diagnosis not present

## 2022-01-27 DIAGNOSIS — R030 Elevated blood-pressure reading, without diagnosis of hypertension: Secondary | ICD-10-CM | POA: Diagnosis not present

## 2022-02-06 ENCOUNTER — Other Ambulatory Visit: Payer: Self-pay

## 2022-02-06 ENCOUNTER — Emergency Department (HOSPITAL_BASED_OUTPATIENT_CLINIC_OR_DEPARTMENT_OTHER): Payer: Medicare HMO

## 2022-02-06 ENCOUNTER — Encounter (HOSPITAL_BASED_OUTPATIENT_CLINIC_OR_DEPARTMENT_OTHER): Payer: Self-pay | Admitting: Emergency Medicine

## 2022-02-06 ENCOUNTER — Emergency Department (HOSPITAL_BASED_OUTPATIENT_CLINIC_OR_DEPARTMENT_OTHER)
Admission: EM | Admit: 2022-02-06 | Discharge: 2022-02-06 | Disposition: A | Discharge status: Home / Self Care | Payer: Medicare HMO | Attending: Emergency Medicine | Admitting: Emergency Medicine

## 2022-02-06 DIAGNOSIS — E039 Hypothyroidism, unspecified: Secondary | ICD-10-CM | POA: Insufficient documentation

## 2022-02-06 DIAGNOSIS — R000 Tachycardia, unspecified: Secondary | ICD-10-CM | POA: Insufficient documentation

## 2022-02-06 DIAGNOSIS — Z79890 Hormone replacement therapy (postmenopausal): Secondary | ICD-10-CM | POA: Insufficient documentation

## 2022-02-06 DIAGNOSIS — R0602 Shortness of breath: Secondary | ICD-10-CM | POA: Insufficient documentation

## 2022-02-06 DIAGNOSIS — I10 Essential (primary) hypertension: Secondary | ICD-10-CM | POA: Diagnosis not present

## 2022-02-06 DIAGNOSIS — R002 Palpitations: Secondary | ICD-10-CM | POA: Diagnosis not present

## 2022-02-06 LAB — CBC WITH DIFFERENTIAL/PLATELET
Abs Immature Granulocytes: 0.03 10*3/uL (ref 0.00–0.07)
Basophils Absolute: 0.1 10*3/uL (ref 0.0–0.1)
Basophils Relative: 1 %
Eosinophils Absolute: 0.2 10*3/uL (ref 0.0–0.5)
Eosinophils Relative: 2 %
HCT: 43.2 % (ref 36.0–46.0)
Hemoglobin: 14.4 g/dL (ref 12.0–15.0)
Immature Granulocytes: 0 %
Lymphocytes Relative: 26 %
Lymphs Abs: 2.4 10*3/uL (ref 0.7–4.0)
MCH: 29.3 pg (ref 26.0–34.0)
MCHC: 33.3 g/dL (ref 30.0–36.0)
MCV: 88 fL (ref 80.0–100.0)
Monocytes Absolute: 0.9 10*3/uL (ref 0.1–1.0)
Monocytes Relative: 9 %
Neutro Abs: 5.5 10*3/uL (ref 1.7–7.7)
Neutrophils Relative %: 62 %
Platelets: 322 10*3/uL (ref 150–400)
RBC: 4.91 MIL/uL (ref 3.87–5.11)
RDW: 12.4 % (ref 11.5–15.5)
WBC: 9 10*3/uL (ref 4.0–10.5)
nRBC: 0 % (ref 0.0–0.2)

## 2022-02-06 LAB — COMPREHENSIVE METABOLIC PANEL
ALT: 33 U/L (ref 0–44)
AST: 28 U/L (ref 15–41)
Albumin: 4.1 g/dL (ref 3.5–5.0)
Alkaline Phosphatase: 114 U/L (ref 38–126)
Anion gap: 11 (ref 5–15)
BUN: 31 mg/dL — ABNORMAL HIGH (ref 8–23)
CO2: 21 mmol/L — ABNORMAL LOW (ref 22–32)
Calcium: 9.2 mg/dL (ref 8.9–10.3)
Chloride: 106 mmol/L (ref 98–111)
Creatinine, Ser: 0.91 mg/dL (ref 0.44–1.00)
GFR, Estimated: >60 mL/min (ref >60)
Glucose, Bld: 139 mg/dL — ABNORMAL HIGH (ref 70–99)
Potassium: 3.6 mmol/L (ref 3.5–5.1)
Sodium: 138 mmol/L (ref 135–145)
Total Bilirubin: 0.4 mg/dL (ref 0.3–1.2)
Total Protein: 8.3 g/dL — ABNORMAL HIGH (ref 6.5–8.1)

## 2022-02-06 LAB — BRAIN NATRIURETIC PEPTIDE: B Natriuretic Peptide: 15.7 pg/mL (ref 0.0–100.0)

## 2022-02-06 LAB — TSH: TSH: 3.254 u[IU]/mL (ref 0.350–4.500)

## 2022-02-06 LAB — T4, FREE: Free T4: 0.65 ng/dL (ref 0.61–1.12)

## 2022-02-06 LAB — TROPONIN I (HIGH SENSITIVITY): Troponin I (High Sensitivity): 7 ng/L (ref <18)

## 2022-02-06 LAB — MAGNESIUM: Magnesium: 2.1 mg/dL (ref 1.7–2.4)

## 2022-02-06 LAB — D-DIMER, QUANTITATIVE: D-Dimer, Quant: 0.53 ug/mL-FEU — ABNORMAL HIGH (ref 0.00–0.50)

## 2022-02-06 NOTE — ED Provider Notes (Signed)
9:17 AM Assumed care of patient from off-going team. For more details, please see note from same day.  In brief, this is a 72 y.o. female w/ HTN who p/w palpitations w/ associated SOB. Woke her from sleep, HR in 140s and normotensive at that time at home. No associated CP, lightheadedness.   Plan/Dispo at time of sign-out & ED Course since sign-out: '[ ]'$  labs  BP 136/78   Pulse 76   Temp 98.2 F (36.8 C)   Resp 13   Ht '5\' 5"'$  (1.651 m)   Wt 88.9 kg   SpO2 96%   BMI 32.62 kg/m    ED Course:   Clinical Course as of 02/06/22 0917  Sat Feb 06, 2022  0827 B Natriuretic Peptide: 15.7 [HN]  0827 Troponin I (High Sensitivity): 7 [HN]  0827 Magnesium: 2.1 [HN]  0827 DG Chest 2 View No evidence of active disease. [HN]  0827 EKG shows sinus tach with no ST changes [HN]  0827 D-Dimer, Quant(!): 0.53 Neg age-adjusted dimer [HN]  0848 Patient's workup is very reassuring.  TSH and free T4 levels are unfortunately send outs and will take a couple more hours to result.  I discussed with patient her negative workup and suggested that she follow these lab results up on MyChart. [HN]  0911 On further history, patient started irbesartan a few days ago and doubled her dose yesterday. Took her first doubled dose at 3 pm. She also was prescribed amlodipine 5 mg but has not picked it up yet.  Possible that the increased dose of her blood pressure medication caused an increase in heart rate.  She has a follow-up appointment scheduled with her primary care physician on Wednesday.  I advised patient to go back to the original dose of the irbesartan and not take the double dose anymore.  I also advised her to not start the amlodipine and to call her physician on Monday morning.  Possible that she is having side effects, though her blood pressure by her log was not low at home during the time that her heart rate was high.  She resolved here without any interventions.  I advised patient that if her heart rate is high  at home again to sit calmly for 10 or 15 minutes and drink some fluids and recheck it.  However, if it is associated with any chest pain or shortness of breath or lightheadedness that she should return to ED. All questions answered to patient's understanding. DC w/ discharge instructions/return precautions. [HN]    Clinical Course User Index [HN] Audley Hose, MD    Dispo: DC ------------------------------- Cindee Lame, MD Emergency Medicine  This note was created using dictation software, which may contain spelling or grammatical errors.   Audley Hose, MD 02/06/22 [phone]

## 2022-02-06 NOTE — ED Provider Notes (Signed)
[place] [facility] Provider Note   CSN: [id number] Arrival date & time: 02/06/22  0555     History  Chief Complaint  Patient presents with   Palpitations    Taylor Lee is a 72 y.o. female.  72 year old female with a history of hypothyroidism and hypertension presents the ER today with palpitations.  Patient states that she has had some hypertension over the last couple weeks being followed by her doctor and actually added a new blood pressure medication yesterday to be started today.  She states that she is been trying to lose weight and she been exercising more but over the last 2 weeks she has had significant dyspnea on exertion especially when going uphill.  She recovers easily.  No chest pain with it.  No cough.  No fevers.  No trauma.  She states that time before bed she had a heart rate of 119 asymptomatic.  It came down to the 80s so she went to bed.  She woke up this morning her heart rate was 140-150 and this worried her so she presents here for further evaluation.  She states that her heart rate started to improve on the way here and in triage was 114 and then now she states that she does not feel an abnormal heart rate at all when it is in the 80s.  My note that her oxygen level was 93 to 95% on room air she does not relate any history of smoking or any kind of lung disease.  No recent illnesses to account for that.  No lower extremity swelling.   Palpitations      Home Medications Prior to Admission medications   Medication Sig Start Date End Date Taking? Authorizing Provider  HYDROcodone-acetaminophen (NORCO/VICODIN) 5-325 MG tablet Take 1 tablet by mouth 2 (two) times daily as needed for moderate pain.    [provider]  liothyronine (CYTOMEL) 5 MCG tablet Take 5-10 mcg by mouth in the morning and at bedtime. 10 mg in am and 1 tablet in evening 01/26/19   [provider]  pantoprazole (PROTONIX) 40 MG tablet   12/19/18   [provider]  SYNTHROID 75 MCG tablet TAKE 1 TABLET BY MOUTH DAILY 04/08/12   Panosh, Standley Brooking, MD      Allergies    Prednisone, Levothyroxine, and Propoxyphene n-acetaminophen    Review of Systems   Review of Systems  Cardiovascular:  Positive for palpitations.    Physical Exam Updated Vital Signs BP (!) 162/89   Pulse 90   Temp 98.2 F (36.8 C)   Resp 15   Ht '5\' 5"'$  (1.651 m)   Wt 88.9 kg   SpO2 95%   BMI 32.62 kg/m  Physical Exam Vitals and nursing note reviewed.  Constitutional:      Appearance: She is well-developed.  HENT:     Head: Normocephalic and atraumatic.     Mouth/Throat:     Mouth: Mucous membranes are moist.     Pharynx: Oropharynx is clear.  Eyes:     Pupils: Pupils are equal, round, and reactive to light.  Cardiovascular:     Rate and Rhythm: Regular rhythm. Tachycardia present.  Pulmonary:     Effort: No respiratory distress.     Breath sounds: No stridor. No wheezing or rales.     Comments: Mildly hypoxic 93-95 Abdominal:     General: Abdomen is flat. There is no distension.  Musculoskeletal:  General: No swelling or tenderness. Normal range of motion.     Cervical back: Normal range of motion.  Skin:    General: Skin is warm and dry.  Neurological:     General: No focal deficit present.     Mental Status: She is alert.     ED Results / Procedures / Treatments   Labs (all labs ordered are listed, but only abnormal results are displayed) Labs Reviewed  CBC WITH DIFFERENTIAL/PLATELET  COMPREHENSIVE METABOLIC PANEL  TSH  T4, FREE  D-DIMER, QUANTITATIVE  MAGNESIUM  TROPONIN I (HIGH SENSITIVITY)    EKG EKG Interpretation  Date/Time:  Saturday February 06 2022 06:09:18 EST Ventricular Rate:  136 PR Interval:  155 QRS Duration: 77 QT Interval:  276 QTC Calculation: 416 R Axis:   -31 Text Interpretation: Sinus tachycardia Left axis deviation Low voltage, precordial leads Confirmed by Cindee Lame [phone])  on 02/06/2022 7:08:09 AM  Radiology DG Chest 2 View  Result Date: 02/06/2022 CLINICAL DATA:  Palpitations EXAM: CHEST - 2 VIEW COMPARISON:  04/09/2011 FINDINGS: Linear atelectasis or scarring at the right base. There is no edema, consolidation, effusion, or pneumothorax. Normal heart size and mediastinal contours. Artifact from EKG leads. IMPRESSION: No evidence of active disease. Electronically Signed   By: Jorje Guild M.D.   On: 02/06/2022 07:10    Procedures Procedures    Medications Ordered in ED Medications - No data to display  ED Course/ Medical Decision Making/ A&P                            Medical Decision Making Amount and/or Complexity of Data Reviewed Labs: ordered. Decision-making details documented in ED Course. Radiology: ordered. Decision-making details documented in ED Course.  Very well could just be anxiety although that is a diagnosis of exclusion especially 72 year old who has not had any panic attacks in 40 years.  With her dyspnea on exertion, mild hypoxia intermittent tachycardia and even persistently in the 80s a blood clot is possible.  Also consider possible thyroid/electrolyte abnormalities.  Will evaluate for those.  I will check 1 troponin and a BNP as well in case he has a PE.  Low suspicion for sick sinus that she is asymptomatic with these it does not seem like she is actually getting bradycardic.  Her blood pressure slightly high here but she is shows me her log from the last week and a half and the vast majority of them are in the 671I systolic maybe 458K.  Some are in the 120s and some are in the 150s. If these tests are normal I suspect patient would likely be discharged with PCP follow-up. Cxr with mild insterstitial markings but no e/o overt heart failure or fluid overload.  Cbc reassuring.  D dimer appropriate for age adjusted criteria.  Patient stable.  Care transferred pending rest of workup, reeval and ultimate disposition.     Final  Clinical Impression(s) / ED Diagnoses Final diagnoses:  None    Rx / DC Orders ED Discharge Orders     None         Nori Poland, Corene Cornea, MD 02/06/22 2317

## 2022-02-06 NOTE — ED Triage Notes (Signed)
Pt was woken up with heart racing and anxious. HR 140's in triage. Denies pain.

## 2022-02-06 NOTE — Discharge Instructions (Signed)
Thank you for coming to [facility] Emergency Department. You were seen for palpitations and high heart rate. We did an exam, labs, and imaging, and these showed no acute findings.  It is possible that your new medication irbesartan, dehydration, funny heart rhythm, or anxiety contributed to your symptoms.  However you do not have any funny heart rhythm noted here.  Your high heart rate resolved here without any interventions.  Your thyroid labs have not resulted yet and please follow them up on your MyChart at home. If your heart rate is high again at home please sit calmly and hydrate for 10 to 20 minutes.  If it does not go away, you may need to return to the emergency department.  If the symptoms are associated with any lightheadedness or chest pain, please come to the emergency department. It is possible that your irbesartan is contributing to your symptoms as a side effect.  Please go back to the original dose you are taking and do not start the amlodipine that was prescribed by your PCP until you discussed with him on Wednesday. Please stay well-hydrated at home.  Please follow up with your primary care provider as scheduled on 02/10/2022.   Do not hesitate to return to the ED or call 911 if you experience: -Worsening symptoms -Chest pain, shortness of breath -Lightheadedness, passing out -Fevers/chills -Anything else that concerns you

## 2022-02-10 DIAGNOSIS — R000 Tachycardia, unspecified: Secondary | ICD-10-CM | POA: Diagnosis not present

## 2022-02-10 DIAGNOSIS — I10 Essential (primary) hypertension: Secondary | ICD-10-CM | POA: Diagnosis not present

## 2022-02-24 DIAGNOSIS — I10 Essential (primary) hypertension: Secondary | ICD-10-CM | POA: Diagnosis not present

## 2022-02-24 DIAGNOSIS — E1169 Type 2 diabetes mellitus with other specified complication: Secondary | ICD-10-CM | POA: Diagnosis not present

## 2022-02-24 DIAGNOSIS — R002 Palpitations: Secondary | ICD-10-CM | POA: Diagnosis not present

## 2022-02-24 DIAGNOSIS — I700 Atherosclerosis of aorta: Secondary | ICD-10-CM | POA: Diagnosis not present

## 2022-02-24 DIAGNOSIS — G4733 Obstructive sleep apnea (adult) (pediatric): Secondary | ICD-10-CM | POA: Diagnosis not present

## 2022-03-01 NOTE — Progress Notes (Unsigned)
Cardiology Office Note:    Date:  03/02/2022   ID:  Taylor Lee, DOB March 05, 1950, MRN [id number]  PCP:  Taylor Infante, MD  Cardiologist:  None  Electrophysiologist:  None   Referring MD: Taylor Infante, MD   Chief Complaint  Patient presents with   Palpitations    History of Present Illness:    Taylor Lee is a 72 y.o. female with a hx of hyperlipidemia, hypothyroidism, ?rheumatic fever, OSA who is referred by Dr. Joylene Draft for evaluation of tachycardia.  She was seen in the ED for palpitations 02/06/2022.  Workup unremarkable.  She reports she has been having intermittent palpitations for years.  Has increased in frequency recently.  About 1 month ago she woke up with palpitations and lasted minutes.  Felt like heart was fluttering.  Now occurring several times per week, typically short duration.  Denies any lightheadedness, syncope, chest pain, dyspnea, lower extremity edema.  Reports home BP has been 110s to 120s over 70s.  No smoking history.  Family history includes father died of MI at 40 and mother died of CHF at 11.  Calcium score on 09/30/2017 was 0.  BP Readings from Last 3 Encounters:  03/02/22 (!) 142/82  02/06/22 (!) 151/95  03/03/20 132/77     Past Medical History:  Diagnosis Date   DDD (degenerative disc disease), lumbosacral    Depression    HLD (hyperlipidemia)    HNP (herniated nucleus pulposus)    I5-S1   Hypertension 01-2022   Osteopenia    Rheumatic fever    Thyroid disease     Past Surgical History:  Procedure Laterality Date   CESAREAN SECTION     NO PAST SURGERIES     UMBILICAL HERNIA REPAIR  2016    Current Medications: Current Meds  Medication Sig   HYDROcodone-acetaminophen (NORCO/VICODIN) 5-325 MG tablet Take 1 tablet by mouth 2 (two) times daily as needed for moderate pain.   irbesartan (AVAPRO) 150 MG tablet Take 150 mg by mouth daily.   liothyronine (CYTOMEL) 5 MCG tablet Take 5-10 mcg by mouth in the morning and at bedtime. 10  mg in am and 1 tablet in evening   pantoprazole (PROTONIX) 40 MG tablet    SYNTHROID 75 MCG tablet TAKE 1 TABLET BY MOUTH DAILY     Allergies:   Prednisone, Levothyroxine, and Propoxyphene n-acetaminophen   Social History   Socioeconomic History   Marital status: Married    Spouse name: Not on file   Number of children: Not on file   Years of education: Not on file   Highest education level: Not on file  Occupational History   Not on file  Tobacco Use   Smoking status: Never   Smokeless tobacco: Never  Substance and Sexual Activity   Alcohol use: Not Currently   Drug use: No   Sexual activity: Not Currently    Birth control/protection: Pill  Other Topics Concern   Not on file  Social History Narrative   Married   Regular exercise- yes   Dog not sick   Mom had hip fracture was caretaker but doing well.   Social Determinants of Health   Financial Resource Strain: Not on file  Food Insecurity: Not on file  Transportation Needs: Not on file  Physical Activity: Not on file  Stress: Not on file  Social Connections: Not on file     Family History: The patient's family history includes Heart attack in her father and mother; Heart disease in  her mother and another family member; Hip fracture in her mother; Sudden death in an other family member.  ROS:   Please see the history of present illness.     All other systems reviewed and are negative.  EKGs/Labs/Other Studies Reviewed:    The following studies were reviewed today:   EKG:   03/02/2022: Normal sinus rhythm, rate 83, nonspecific T wave flattening  Recent Labs: 02/06/2022: ALT 33; B Natriuretic Peptide 15.7; BUN 31; Creatinine, Ser 0.91; Hemoglobin 14.4; Magnesium 2.1; Platelets 322; Potassium 3.6; Sodium 138; TSH 3.254  Recent Lipid Panel    Component Value Date/Time   CHOL 205 (HH) 06/28/2007 1003   TRIG 114 06/28/2007 1003   HDL 51.6 06/28/2007 1003   CHOLHDL 4.0 CALC 06/28/2007 1003   VLDL 23  06/28/2007 1003   LDLCALC 128 (H) 12/07/2006 0841   LDLDIRECT 143.1 06/28/2007 1003    Physical Exam:    VS:  BP (!) 142/82 (BP Location: Left Arm, Patient Position: Sitting, Cuff Size: Normal)   Pulse 83   Ht 5' 5"$  (1.651 m)   Wt 195 lb 12.8 oz (88.8 kg)   SpO2 96%   BMI 32.58 kg/m     Wt Readings from Last 3 Encounters:  03/02/22 195 lb 12.8 oz (88.8 kg)  02/06/22 196 lb (88.9 kg)  03/03/20 184 lb (83.5 kg)     GEN:  Well nourished, well developed in no acute distress HEENT: Normal NECK: No JVD; No carotid bruits LYMPHATICS: No lymphadenopathy CARDIAC: RRR, no murmurs, rubs, gallops RESPIRATORY:  Clear to auscultation without rales, wheezing or rhonchi  ABDOMEN: Soft, non-tender, non-distended MUSCULOSKELETAL:  No edema; No deformity  SKIN: Warm and dry NEUROLOGIC:  Alert and oriented x 3 PSYCHIATRIC:  Normal affect   ASSESSMENT:    1. Palpitations   2. OSA (obstructive sleep apnea)   3. Essential hypertension   4. Hyperlipidemia, unspecified hyperlipidemia type    PLAN:    Tachycardia/palpitations: Description concerning for arrhythmia, will evaluate with Zio patch x 7 days  ?Rheumatic fever: unclear diagnosis.  Check echocardiogram  Hypertension: on irbesartan 150 mg daily.  BP elevated in clinic but reports has been under control at home.  Asked to check BP twice daily for next week and let us know results  Hyperlipidemia: LDL 91 on 12/23/2021.  Calcium score on 09/30/2017 was 0.  OSA: was on CPAP but reports unable to tolerate.  Currently using oral appliance.  She has not been following with sleep medicine.  Recommend [facility] sleep study while using appliance.  STOP-BANG 3  RTC in 3 months   Medication Adjustments/Labs and Tests Ordered: Current medicines are reviewed at length with the patient today.  Concerns regarding medicines are outlined above.  Orders Placed This Encounter  Procedures   LONG TERM MONITOR (3-14 DAYS)   ECHOCARDIOGRAM COMPLETE    Itamar Sleep Study   No orders of the defined types were placed in this encounter.   Patient Instructions  Medication Instructions:  Your physician recommends that you continue on your current medications as directed. Please refer to the Current Medication list given to you today.  *If you need a refill on your cardiac medications before your next appointment, please call your pharmacy*  Testing/Procedures: Your physician has requested that you have an echocardiogram. Echocardiography is a painless test that uses sound waves to create images of your heart. It provides your doctor with information about the size and shape of your heart and how well your heart's chambers  and valves are working. This procedure takes approximately one hour. There are no restrictions for this procedure. Please do NOT wear cologne, perfume, aftershave, or lotions (deodorant is allowed). Please arrive 15 minutes prior to your appointment time.  ZIO XT- Long Term Monitor Instructions   Your physician has requested you wear a ZIO patch monitor for _7__ days.  This is a single patch monitor.   [employer] supplies one patch monitor per enrollment. Additional stickers are not available. Please do not apply patch if you will be having a Nuclear Stress Test, Echocardiogram, Cardiac CT, MRI, or Chest Xray during the period you would be wearing the monitor. The patch cannot be worn during these tests. You cannot remove and re-apply the ZIO XT patch monitor.  Your ZIO patch monitor will be sent Fed Ex from [employer] directly to your home address. It may take 3-5 days to receive your monitor after you have been enrolled.  Once you have received your monitor, please review the enclosed instructions. Your monitor has already been registered assigning a specific monitor serial # to you.  Billing and Patient Assistance Program Information   We have supplied [employer] with any of your insurance information on file for  billing purposes. [employer] offers a sliding scale Patient Assistance Program for patients that do not have insurance, or whose insurance does not completely cover the cost of the ZIO monitor.   You must apply for the Patient Assistance Program to qualify for this discounted rate.     To apply, please call [employer] at [phone], select option 4, then select option 2, and ask to apply for Patient Assistance Program.  Theodore Demark will ask your household income, and how many people are in your household.  They will quote your out-of-pocket cost based on that information.  [employer] will also be able to set up a 74-month interest-free payment plan if needed.  Applying the monitor   Shave hair from upper left chest.  Hold abrader disc by orange tab. Rub abrader in 40 strokes over the upper left chest as indicated in your monitor instructions.  Clean area with 4 enclosed alcohol pads. Let dry.  Apply patch as indicated in monitor instructions. Patch will be placed under collarbone on left side of chest with arrow pointing upward.  Rub patch adhesive wings for 2 minutes. Remove white label marked "1". Remove the white label marked "2". Rub patch adhesive wings for 2 additional minutes.  While looking in a mirror, press and release button in center of patch. A small green light will flash 3-4 times. This will be your only indicator that the monitor has been turned on. ?  Do not shower for the first 24 hours. You may shower after the first 24 hours.  Press the button if you feel a symptom. You will hear a small click. Record Date, Time and Symptom in the Patient Logbook.  When you are ready to remove the patch, follow instructions on the last 2 pages of the Patient Logbook. Stick patch monitor onto the last page of Patient Logbook.  Place Patient Logbook in the blue and white box.  Use locking tab on box and tape box closed securely.  The blue and white box has prepaid postage on it. Please place it in the mailbox  as soon as possible. Your physician should have your test results approximately 7 days after the monitor has been [facility]  Call [facility]at [phone] you have questions  regarding your ZIO XT patch monitor. Call them immediately if you see an orange light blinking on your monitor.  If your monitor falls off in less than 4 days, contact our Monitor department at [phone]. ?If your monitor becomes loose or falls off after 4 days call [employer] at [phone] for suggestions on securing your monitor.?  WatchPAT?  Is a FDA cleared portable home sleep study test that uses a watch and 3 points of contact to monitor 7 different channels, including your heart rate, oxygen saturations, body position, snoring, and chest motion.  The study is easy to use from the comfort of your own home and accurately detect sleep apnea.  Before bed, you attach the chest sensor, attached the sleep apnea bracelet to your nondominant hand, and attach the finger probe.  After the study, the raw data is downloaded from the watch and scored for apnea events.   For more information: [email or web address]  Patient Testing Instructions:  Do not put battery into the device until bedtime when you are ready to begin the test. Please call the support number if you need assistance after following the instructions below: 24 hour support line- [phone] or [facility] support at [phone] (option 2)  Download the [employer] One" app through the google play store or App Store  Be sure to turn on or enable access to bluetooth in settlings on your smartphone/ device  Make sure no other bluetooth devices are on and within the vicinity of your smartphone/ device and WatchPAT watch during testing.  Make sure to leave your smart phone/ device plugged in and charging all night.  When ready for bed:  Follow the instructions step by step in the WatchPAT One App to activate  the testing device. For additional instructions, including video instruction, visit the WatchPAT One video on Youtube. You can search for [place] One within Youtube (video is 4 minutes and 18 seconds) or enter: https://youtube/watch?v=BCce_vbiwxE Please note: You will be prompted to enter a Pin to connect via bluetooth when starting the test. The PIN will be assigned to you when you receive the test.  The device is disposable, but it recommended that you retain the device until you receive a call letting you know the study has been received and the results have been interpreted.  We will let you know if the study did not transmit to [place] properly after the test is completed. You do not need to call us to confirm the receipt of the test.  Please complete the test within 48 hours of receiving PIN.   Frequently Asked Questions:  What is Watch [place] one?  A single use fully disposable home sleep apnea testing device and will not need to be returned after completion.  What are the requirements to use WatchPAT one?  The be able to have a successful watchpat one sleep study, you should have your Watch pat one device, your smart phone, watch pat one app, your PIN number and Internet access What type of phone do I need?  You should have a smart phone that uses Android 5.1 and above or any Iphone with IOS 10 and above How can I download the WatchPAT one app?  Based on your device type search for WatchPAT one app either in google play for android devices or APP store for Iphone's Where will I get my PIN for the study?  Your PIN will be provided by your physician's office. It is used for authentication and if you lose/forget your PIN, please  reach out to your providers office.  I do not have Internet at home. Can I do WatchPAT one study?  WatchPAT One needs Internet connection throughout the night to be able to transmit the sleep data. You can use your home/local internet or your cellular's data package. However,  it is always recommended to use home/local Internet. It is estimated that between 20MB-30MB will be used with each study.However, the application will be looking for 80MB space in the phone to start the study.  What happens if I lose internet or bluetooth connection?  During the internet disconnection, your phone will not be able to transmit the sleep data. All the data, will be stored in your phone. As soon as the internet connection is back on, the phone will being sending the sleep data. During the bluetooth disconnection, WatchPAT one will not be able to to send the sleep data to your phone. Data will be kept in the [facility] one until two devices have bluetooth connection back on. As soon as the connection is back on, WatchPAT one will send the sleep data to the phone.  How long do I need to wear the WatchPAT one?  After you start the study, you should wear the device at least 6 hours.  How far should I keep my phone from the device?  During the night, your phone should be within 15 feet.  What happens if I leave the room for restroom or other reasons?  Leaving the room for any reason will not cause any problem. As soon as your get back to the room, both devices will reconnect and will continue to send the sleep data. Can I use my phone during the sleep study?  Yes, you can use your phone as usual during the study. But it is recommended to put your watchpat one on when you are ready to go to bed.  How will I get my study results?  A soon as you completed your study, your sleep data will be sent to the provider. They will then share the results with you when they are ready.   Follow-Up: At [facility], you and your health needs are our priority.  As part of our continuing mission to provide you with exceptional heart care, we have created designated Provider Care Teams.  These Care Teams include your primary Cardiologist (physician) and Advanced Practice Providers (APPs -  Physician  Assistants and Nurse Practitioners) who all work together to provide you with the care you need, when you need it.  We recommend signing up for the patient portal called "MyChart".  Sign up information is provided on this After Visit Summary.  MyChart is used to connect with patients for Virtual Visits (Telemedicine).  Patients are able to view lab/test results, encounter notes, upcoming appointments, etc.  Non-urgent messages can be sent to your provider as well.   To learn more about what you can do with MyChart, go to [email or web address].    Your next appointment:   3 month(s)  Provider:   Dr. Gardiner Rhyme Other Instructions Please check your blood pressure at home twice daily, write it down.  Call the office or send message via Mychart with the readings in 1 week for Dr. Gardiner Rhyme to review.      Signed, Donato Heinz, MD  03/02/2022 3:09 PM    [facility]

## 2022-03-02 ENCOUNTER — Ambulatory Visit (INDEPENDENT_AMBULATORY_CARE_PROVIDER_SITE_OTHER): Payer: Medicare HMO

## 2022-03-02 ENCOUNTER — Encounter: Payer: Self-pay | Admitting: Cardiology

## 2022-03-02 ENCOUNTER — Ambulatory Visit: Payer: Medicare HMO | Attending: Cardiology | Admitting: Cardiology

## 2022-03-02 VITALS — BP 142/82 | HR 83 | Ht 65.0 in | Wt 195.8 lb

## 2022-03-02 DIAGNOSIS — R002 Palpitations: Secondary | ICD-10-CM

## 2022-03-02 DIAGNOSIS — E785 Hyperlipidemia, unspecified: Secondary | ICD-10-CM

## 2022-03-02 DIAGNOSIS — G4733 Obstructive sleep apnea (adult) (pediatric): Secondary | ICD-10-CM | POA: Diagnosis not present

## 2022-03-02 DIAGNOSIS — I10 Essential (primary) hypertension: Secondary | ICD-10-CM

## 2022-03-02 NOTE — Patient Instructions (Signed)
Medication Instructions:  Your physician recommends that you continue on your current medications as directed. Please refer to the Current Medication list given to you today.  *If you need a refill on your cardiac medications before your next appointment, please call your pharmacy*  Testing/Procedures: Your physician has requested that you have an echocardiogram. Echocardiography is a painless test that uses sound waves to create images of your heart. It provides your doctor with information about the size and shape of your heart and how well your heart's chambers and valves are working. This procedure takes approximately one hour. There are no restrictions for this procedure. Please do NOT wear cologne, perfume, aftershave, or lotions (deodorant is allowed). Please arrive 15 minutes prior to your appointment time.  ZIO XT- Long Term Monitor Instructions   Your physician has requested you wear a ZIO patch monitor for _7__ days.  This is a single patch monitor.   [employer] supplies one patch monitor per enrollment. Additional stickers are not available. Please do not apply patch if you will be having a Nuclear Stress Test, Echocardiogram, Cardiac CT, MRI, or Chest Xray during the period you would be wearing the monitor. The patch cannot be worn during these tests. You cannot remove and re-apply the ZIO XT patch monitor.  Your ZIO patch monitor will be sent Fed Ex from [employer] directly to your home address. It may take 3-5 days to receive your monitor after you have been enrolled.  Once you have received your monitor, please review the enclosed instructions. Your monitor has already been registered assigning a specific monitor serial # to you.  Billing and Patient Assistance Program Information   We have supplied [employer] with any of your insurance information on file for billing purposes. [employer] offers a sliding scale Patient Assistance Program for patients that do not have insurance,  or whose insurance does not completely cover the cost of the ZIO monitor.   You must apply for the Patient Assistance Program to qualify for this discounted rate.     To apply, please call [employer] at [phone], select option 4, then select option 2, and ask to apply for Patient Assistance Program.  Theodore Demark will ask your household income, and how many people are in your household.  They will quote your out-of-pocket cost based on that information.  [employer] will also be able to set up a 10-month interest-free payment plan if needed.  Applying the monitor   Shave hair from upper left chest.  Hold abrader disc by orange tab. Rub abrader in 40 strokes over the upper left chest as indicated in your monitor instructions.  Clean area with 4 enclosed alcohol pads. Let dry.  Apply patch as indicated in monitor instructions. Patch will be placed under collarbone on left side of chest with arrow pointing upward.  Rub patch adhesive wings for 2 minutes. Remove white label marked "1". Remove the white label marked "2". Rub patch adhesive wings for 2 additional minutes.  While looking in a mirror, press and release button in center of patch. A small green light will flash 3-4 times. This will be your only indicator that the monitor has been turned on. ?  Do not shower for the first 24 hours. You may shower after the first 24 hours.  Press the button if you feel a symptom. You will hear a small click. Record Date, Time and Symptom in the Patient Logbook.  When you are ready to remove the patch, follow instructions on the last  2 pages of the Patient Logbook. Stick patch monitor onto the last page of Patient Logbook.  Place Patient Logbook in the blue and white box.  Use locking tab on box and tape box closed securely.  The blue and white box has prepaid postage on it. Please place it in the mailbox as soon as possible. Your physician should have your test results approximately 7 days after the monitor has been  mailed back to [facility].  Call [place] at [phone] if you have questions regarding your ZIO XT patch monitor. Call them immediately if you see an orange light blinking on your monitor.  If your monitor falls off in less than 4 days, contact our Monitor department at [phone]. ?If your monitor becomes loose or falls off after 4 days call [employer] at [phone] for suggestions on securing your monitor.?  WatchPAT?  Is a FDA cleared portable home sleep study test that uses a watch and 3 points of contact to monitor 7 different channels, including your heart rate, oxygen saturations, body position, snoring, and chest motion.  The study is easy to use from the comfort of your own home and accurately detect sleep apnea.  Before bed, you attach the chest sensor, attached the sleep apnea bracelet to your nondominant hand, and attach the finger probe.  After the study, the raw data is downloaded from the watch and scored for apnea events.   For more information: [email or web address]  Patient Testing Instructions:  Do not put battery into the device until bedtime when you are ready to begin the test. Please call the support number if you need assistance after following the instructions below: 24 hour support line- [phone] or [employer] support at [phone] (option 2)  Download the [employer] AmericanWatchPAT One" app through the google play store or App Store  Be sure to turn on or enable access to bluetooth in settlings on your smartphone/ device  Make sure no other bluetooth devices are on and within the vicinity of your smartphone/ device and WatchPAT watch during testing.  Make sure to leave your smart phone/ device plugged in and charging all night.  When ready for bed:  Follow the instructions step by step in the WatchPAT One App to activate the testing device. For additional instructions, including video instruction, visit the WatchPAT One video on  Youtube. You can search for [place] One within Youtube (video is 4 minutes and 18 seconds) or enter: https://youtube/watch?v=BCce_vbiwxE Please note: You will be prompted to enter a Pin to connect via bluetooth when starting the test. The PIN will be assigned to you when you receive the test.  The device is disposable, but it recommended that you retain the device until you receive a call letting you know the study has been received and the results have been interpreted.  We will let you know if the study did not transmit to [place] properly after the test is completed. You do not need to call us to confirm the receipt of the test.  Please complete the test within 48 hours of receiving PIN.   Frequently Asked Questions:  What is Watch [place] one?  A single use fully disposable home sleep apnea testing device and will not need to be returned after completion.  What are the requirements to use WatchPAT one?  The be able to have a successful watchpat one sleep study, you should have your Watch pat one device, your smart phone, watch pat one app, your PIN number and Internet access  What type of phone do I need?  You should have a smart phone that uses Android 5.1 and above or any Iphone with IOS 10 and above How can I download the WatchPAT one app?  Based on your device type search for WatchPAT one app either in google play for android devices or APP store for Iphone's Where will I get my PIN for the study?  Your PIN will be provided by your physician's office. It is used for authentication and if you lose/forget your PIN, please reach out to your providers office.  I do not have Internet at home. Can I do WatchPAT one study?  WatchPAT One needs Internet connection throughout the night to be able to transmit the sleep data. You can use your home/local internet or your cellular's data package. However, it is always recommended to use home/local Internet. It is estimated that between 20MB-30MB will be used with  each study.However, the application will be looking for 80MB space in the phone to start the study.  What happens if I lose internet or bluetooth connection?  During the internet disconnection, your phone will not be able to transmit the sleep data. All the data, will be stored in your phone. As soon as the internet connection is back on, the phone will being sending the sleep data. During the bluetooth disconnection, WatchPAT one will not be able to to send the sleep data to your phone. Data will be kept in the [facility] one until two devices have bluetooth connection back on. As soon as the connection is back on, WatchPAT one will send the sleep data to the phone.  How long do I need to wear the WatchPAT one?  After you start the study, you should wear the device at least 6 hours.  How far should I keep my phone from the device?  During the night, your phone should be within 15 feet.  What happens if I leave the room for restroom or other reasons?  Leaving the room for any reason will not cause any problem. As soon as your get back to the room, both devices will reconnect and will continue to send the sleep data. Can I use my phone during the sleep study?  Yes, you can use your phone as usual during the study. But it is recommended to put your watchpat one on when you are ready to go to bed.  How will I get my study results?  A soon as you completed your study, your sleep data will be sent to the provider. They will then share the results with you when they are ready.   Follow-Up: At [facility], you and your health needs are our priority.  As part of our continuing mission to provide you with exceptional heart care, we have created designated Provider Care Teams.  These Care Teams include your primary Cardiologist (physician) and Advanced Practice Providers (APPs -  Physician Assistants and Nurse Practitioners) who all work together to provide you with the care you need, when you need  it.  We recommend signing up for the patient portal called "MyChart".  Sign up information is provided on this After Visit Summary.  MyChart is used to connect with patients for Virtual Visits (Telemedicine).  Patients are able to view lab/test results, encounter notes, upcoming appointments, etc.  Non-urgent messages can be sent to your provider as well.   To learn more about what you can do with MyChart, go to [email or web address].  Your next appointment:   3 month(s)  Provider:   Dr. Gardiner Rhyme Other Instructions Please check your blood pressure at home twice daily, write it down.  Call the office or send message via Mychart with the readings in 1 week for Dr. Gardiner Rhyme to review.

## 2022-03-02 NOTE — Progress Notes (Unsigned)
Enrolled patient for a 7 day Zio XT monitor to be mailed to patients home.  

## 2022-03-03 ENCOUNTER — Telehealth: Payer: Self-pay | Admitting: Cardiology

## 2022-03-03 ENCOUNTER — Telehealth: Payer: Self-pay | Admitting: *Deleted

## 2022-03-03 ENCOUNTER — Telehealth: Payer: Self-pay

## 2022-03-03 DIAGNOSIS — I10 Essential (primary) hypertension: Secondary | ICD-10-CM | POA: Diagnosis not present

## 2022-03-03 LAB — BASIC METABOLIC PANEL
BUN/Creatinine Ratio: 24 (ref 12–28)
BUN: 17 mg/dL (ref 8–27)
CO2: 23 mmol/L (ref 20–29)
Calcium: 9.3 mg/dL (ref 8.7–10.3)
Chloride: 104 mmol/L (ref 96–106)
Creatinine, Ser: 0.7 mg/dL (ref 0.57–1.00)
Glucose: 116 mg/dL — ABNORMAL HIGH (ref 70–99)
Potassium: 4.1 mmol/L (ref 3.5–5.2)
Sodium: 139 mmol/L (ref 134–144)
eGFR: 92 mL/min/{1.73_m2} (ref >59)

## 2022-03-03 NOTE — Telephone Encounter (Signed)
Secure chat message sent to Taylor Lee ok to activate itamar device.

## 2022-03-03 NOTE — Telephone Encounter (Signed)
Unable to locate this in provider note. Routed to MD/RN

## 2022-03-03 NOTE — Telephone Encounter (Signed)
Called and made the patient aware that SHE may proceed with the [facility] Sleep Study. PIN # provided to the patient. Patient made aware that SHE will be contacted after the test has been read with the results and any recommendations. Patient verbalized understanding and thanked me for the call.

## 2022-03-03 NOTE — Telephone Encounter (Signed)
Pt states that at visit yesterday provider was supposed to put order in for Kidney function to be checked. Pt would like a call when order has been put in so that she can have labs drawn. Please advise

## 2022-03-03 NOTE — Telephone Encounter (Signed)
Order placed.  Patient aware.

## 2022-03-03 NOTE — Telephone Encounter (Signed)
Yes lets go ahead and check a BMET since she was recently started on irbesartan

## 2022-03-04 NOTE — Addendum Note (Signed)
Addended by: Brantley Persons A on: 03/04/2022 12:23 PM   Modules accepted: Orders

## 2022-03-07 ENCOUNTER — Encounter (INDEPENDENT_AMBULATORY_CARE_PROVIDER_SITE_OTHER): Payer: Medicare HMO | Admitting: Cardiology

## 2022-03-07 DIAGNOSIS — G4733 Obstructive sleep apnea (adult) (pediatric): Secondary | ICD-10-CM

## 2022-03-08 DIAGNOSIS — R002 Palpitations: Secondary | ICD-10-CM | POA: Diagnosis not present

## 2022-03-09 ENCOUNTER — Telehealth: Payer: Self-pay | Admitting: Cardiology

## 2022-03-09 ENCOUNTER — Ambulatory Visit: Payer: Medicare HMO | Attending: Cardiology

## 2022-03-09 DIAGNOSIS — G4733 Obstructive sleep apnea (adult) (pediatric): Secondary | ICD-10-CM

## 2022-03-09 NOTE — Procedures (Signed)
SLEEP STUDY REPORT  Patient Information Study Date: 03/07/2022 Patient Name: Taylor Lee Patient ID: [id number] Birth Date: 12/04/1950 Age: 72 Gender: Female BMI: 32.7 (W=196 lb, H=5' 5'') Referring Physician: Oswaldo Milian, MD  TEST DESCRIPTION: Home sleep apnea testing was completed using the WatchPat, a Type 1 device, utilizing peripheral arterial tonometry (PAT), chest movement, actigraphy, pulse oximetry, pulse rate, body position and snore. AHI was calculated with apnea and hypopnea using valid sleep time as the denominator. RDI includes apneas, hypopneas, and RERAs. The data acquired and the scoring of sleep and all associated events were performed in accordance with the recommended standards and specifications as outlined in the AASM Manual for the Scoring of Sleep and Associated Events 2.2.0 (2015).   FINDINGS:   1. Mild Obstructive Sleep Apnea with AHI 14.5/hr.   2. No central Sleep Apnea with pAHIc 0.2/hr.   3. Oxygen desaturations as low as 78%.   4. Mild to moderate snoring was present. O2 sats were < 88% for 26 min.   5. Total sleep time was 6 hrs and 7 min.   6. 21% of total sleep time was spent in REM sleep.   7. Normal sleep onset latency at 18 min.   8. Shortened REM sleep onset latency at 49 min.   9. Total awakenings were 12 .  10. Arrhythmia detection:  None.  DIAGNOSIS: Mild to Moderate Obstructive Sleep Apnea (G47.33) Nocturnal Hypoxemia RECOMMENDATIONS:   1.  Clinical correlation of these findings is necessary.  The decision to treat obstructive sleep apnea (OSA) is usually based on the presence of apnea symptoms or the presence of associated medical conditions such as Hypertension, Congestive Heart Failure, Atrial Fibrillation or Obesity.  The most common symptoms of OSA are snoring, gasping for breath while sleeping, daytime sleepiness and fatigue.   2.  Initiating apnea therapy is recommended given the presence of symptoms and/or  associated conditions. Recommend proceeding with one of the following:     a.  Auto-CPAP therapy with a pressure range of 5-20cm H2O.     b.  An oral appliance (OA) that can be obtained from certain dentists with expertise in sleep medicine.  These are primarily of use in non-obese patients with mild and moderate disease.     c.  An ENT consultation which may be useful to look for specific causes of obstruction and possible treatment options.     d.  If patient is intolerant to PAP therapy, consider referral to ENT for evaluation for hypoglossal nerve stimulator.   3.  Close follow-up is necessary to ensure success with CPAP or oral appliance therapy for maximum benefit.  4.  A follow-up oximetry study on CPAP is recommended to assess the adequacy of therapy and determine the need for supplemental oxygen or the potential need for Bi-level therapy.  An arterial blood gas to determine the adequacy of baseline ventilation and oxygenation should also be considered.  5.  Healthy sleep recommendations include:  adequate nightly sleep (normal 7-9 hrs/night), avoidance of caffeine after noon and alcohol near bedtime, and maintaining a sleep environment that is cool, dark and quiet.  6.  Weight loss for overweight patients is recommended.  Even modest amounts of weight loss can significantly improve the severity of sleep apnea.  7.  Snoring recommendations include:  weight loss where appropriate, side sleeping, and avoidance of alcohol before bed.  8.  Operation of motor vehicle should be avoided when sleepy.  Signature:  Fransico Him, MD; [facility]; [place], [place] Board of Sleep Medicine Electronically Signed: 03/09/2022

## 2022-03-09 NOTE — Telephone Encounter (Signed)
Pt wanted to know if she could still go to chiropractor for activator treatment. Recommended she contact chiropractor and see how they can position her to keep pressure off Zio. Possible donut whole. She also stated her bra touches the Zio wing. Recommended she use a cloth or paper towel between bra and wing to keep the wing from becoming loose.

## 2022-03-09 NOTE — Telephone Encounter (Signed)
Pt would like a callback regarding heart monitor. Please advise.

## 2022-03-11 ENCOUNTER — Telehealth: Payer: Self-pay | Admitting: Cardiology

## 2022-03-11 NOTE — Telephone Encounter (Signed)
Patient calling with bp readying  2/14 7:45am 138/81 HR 66         3:00pm 131/76 HR 72         11:00pm 133/70 HR73  2/15 7:45am 129/80 HR 70         4:30pm 137/76 HR 65         10:30pm 132/68 HR 64  2/16 6:30pm 132/75 HR 68         11:30pm 116/62 HR 60  2/17 1:30pm 129/72 HR 61          11:50pm 123/61 HR 65  2/18 8:00am 129/79 HR 57         10:15pm 126/75 HR 66  2/19 8:00am 124/72 HR 58         10:15pm 115/62 HR 65  2/20 8:30am 130/80 HR 59         11:15pm 120/72 HR 54

## 2022-03-11 NOTE — Telephone Encounter (Signed)
Returned call to patient who states she was just calling in to report BP readings as requested from Dr. Gardiner Rhyme. Advised patient that these look good overall and I will forward to Dr. Gardiner Rhyme for him to review and advise.   Patient would also like to go over sleep study results-advised patient I would forward over to our sleep dept for them to review. Patient aware and verbalized understanding.

## 2022-03-12 ENCOUNTER — Telehealth: Payer: Self-pay | Admitting: *Deleted

## 2022-03-12 NOTE — Telephone Encounter (Signed)
Blood pressure looks good, no changes recommended

## 2022-03-12 NOTE — Telephone Encounter (Signed)
-----   Message from Sueanne Margarita, MD sent at 03/09/2022 12:49 PM EST ----- Please let patient know that they have sleep apnea.  Recommend therapeutic CPAP titration for treatment of patient's sleep disordered breathing.  If unable to perform an in lab titration then initiate ResMed auto CPAP from 4 to 15cm H2O with heated humidity and mask of choice and overnight pulse ox on CPAP.

## 2022-03-12 NOTE — Telephone Encounter (Signed)
Called the patient to give her sleep study results and recommendations. She states that she has a CPAP machine that she has not used in over a year. She could not tolerate it. She kept pulling the mask off. We discussed the inspire device and she states she cannot do that. Then we discussed oral appliances. She informed that she has a oral appliance that she got from her dentist and she sleeps with it every night. She did sleep with it the night she did the sleep study. Patient is requesting appointment with MD to discuss. She was informed that the MD will be going out on leave next week until at least April, so more than likely the appointment won't happen until she returns. Patient voiced understanding of this and wants to know what she can do in the meantime. She also states that she will contact the dentist to see if he can adjust her oral device and maybe repeat the sleep study if he does make adjustments Message will be sent to Dr Radford Pax for review and further recommendations.

## 2022-03-16 ENCOUNTER — Encounter: Payer: Self-pay | Admitting: *Deleted

## 2022-03-16 NOTE — Telephone Encounter (Signed)
See my chart message

## 2022-03-22 DIAGNOSIS — Z1231 Encounter for screening mammogram for malignant neoplasm of breast: Secondary | ICD-10-CM | POA: Diagnosis not present

## 2022-03-23 DIAGNOSIS — R002 Palpitations: Secondary | ICD-10-CM | POA: Diagnosis not present

## 2022-03-29 ENCOUNTER — Ambulatory Visit (HOSPITAL_COMMUNITY): Payer: Medicare HMO | Attending: Cardiovascular Disease

## 2022-03-29 DIAGNOSIS — I340 Nonrheumatic mitral (valve) insufficiency: Secondary | ICD-10-CM | POA: Diagnosis not present

## 2022-03-29 DIAGNOSIS — R002 Palpitations: Secondary | ICD-10-CM | POA: Diagnosis not present

## 2022-03-29 DIAGNOSIS — R509 Fever, unspecified: Secondary | ICD-10-CM | POA: Insufficient documentation

## 2022-03-29 DIAGNOSIS — I10 Essential (primary) hypertension: Secondary | ICD-10-CM | POA: Diagnosis not present

## 2022-03-29 DIAGNOSIS — E785 Hyperlipidemia, unspecified: Secondary | ICD-10-CM | POA: Diagnosis not present

## 2022-03-29 LAB — ECHOCARDIOGRAM COMPLETE
Area-P 1/2: 3.27 cm2
MV M vel: 5.8 m/s
MV Peak grad: 134.6 mmHg
MV VTI: 1.81 cm2
S' Lateral: 2.7 cm

## 2022-03-30 ENCOUNTER — Telehealth: Payer: Self-pay | Admitting: Cardiology

## 2022-03-30 NOTE — Telephone Encounter (Signed)
Patient calling to discuss her mychart message. Please advise

## 2022-03-30 NOTE — Telephone Encounter (Signed)
Called patient, advised of the results from monitor (SVT) patient concerned with taking medications, we did discuss what the medication is for, she was concerned with side effects, advised her to let us know if anything changed after starting it. Patient verbalized understanding, gave echo results as well. Patient thankful for call back, she feels better having an understanding of everything.

## 2022-03-31 ENCOUNTER — Other Ambulatory Visit: Payer: Self-pay | Admitting: *Deleted

## 2022-03-31 MED ORDER — METOPROLOL SUCCINATE ER 25 MG PO TB24: 25.0000 mg | ORAL_TABLET | Freq: Every day | ORAL | 3 refills | Status: DC

## 2022-04-01 DIAGNOSIS — G4733 Obstructive sleep apnea (adult) (pediatric): Secondary | ICD-10-CM | POA: Diagnosis not present

## 2022-04-14 DIAGNOSIS — G4733 Obstructive sleep apnea (adult) (pediatric): Secondary | ICD-10-CM | POA: Diagnosis not present

## 2022-04-28 ENCOUNTER — Encounter: Payer: Self-pay | Admitting: Cardiology

## 2022-04-28 ENCOUNTER — Ambulatory Visit: Payer: Medicare HMO | Attending: Cardiology | Admitting: Cardiology

## 2022-04-28 ENCOUNTER — Telehealth: Payer: Self-pay | Admitting: *Deleted

## 2022-04-28 VITALS — BP 128/74 | HR 72 | Ht 65.0 in | Wt 182.0 lb

## 2022-04-28 DIAGNOSIS — G4733 Obstructive sleep apnea (adult) (pediatric): Secondary | ICD-10-CM | POA: Diagnosis not present

## 2022-04-28 DIAGNOSIS — I10 Essential (primary) hypertension: Secondary | ICD-10-CM

## 2022-04-28 NOTE — Telephone Encounter (Signed)
-----   Message from Luellen Pucker, RN sent at 04/28/2022  3:17 PM EDT ----- Regarding: Travel auto cpap orders Per Dr. Mayford Knife,  Patient needs travel auto cpap 7-13 cm H20 ordered.   Thanks! Alcario Drought

## 2022-04-28 NOTE — Patient Instructions (Signed)
Medication Instructions:  Your physician recommends that you continue on your current medications as directed. Please refer to the Current Medication list given to you today.  *If you need a refill on your cardiac medications before your next appointment, please call your pharmacy*   Lab Work: None.  If you have labs (blood work) drawn today and your tests are completely normal, you will receive your results only by: MyChart Message (if you have MyChart) OR A paper copy in the mail If you have any lab test that is abnormal or we need to change your treatment, we will call you to review the results.   Testing/Procedures: None.   Follow-Up:    Your next appointment:   1 year(s)  Provider:   Dr. Armanda Magic, MD   Other Instructions Dr. Mayford Knife has changed your cpap settings. Someone from our sleep team or from your DME will reach out to you about this.

## 2022-04-28 NOTE — Progress Notes (Signed)
Sleep Medicine CONSULT Note    Date:  04/28/2022   ID:  Taylor Killatricia R Allerton, DOB 09-02-1950, MRN [id number]  PCP:  Rodrigo RanPerini, Mark, MD  Cardiologist: Epifanio Lescheshristopher Schumann, MD  Chief Complaint  Patient presents with   New Patient (Initial Visit)    Obstructive sleep apnea    History of Present Illness:  Taylor Lee is a 72 y.o. female who is being seen today for the evaluation of obstructive sleep apnea at the request of Epifanio Lescheshristopher Schumann, MD.  This is a 72 year old female with a history of hyperlipidemia, hypertension, rheumatic fever and thyroid disease.  She was seen by Dr. Bjorn PippinSchumann on 03/02/2022 at which time she told Dr. Jerene PitchSchuman she had a history of sleep apnea in the past and had been on CPAP but was unable to tolerate it.  At that time she was using an oral appliance.  She had not been following anyone with sleep medicine.  She underwent a home sleep study showing mild obstructive sleep apnea with an AHI of 14.5/h overall severe obstructive sleep apnea during REM sleep with a REM AHI of 34.9/h with nocturnal hypoxemia with O2 sats less than 88% for at least 26 minutes and an O2 sat nadir of 78%.  Events occurred in supine and  nonsupine positions.    She has started back on her CPAP that she was on before.  She is on a current setting of auto CPAP 7-13cm H2O with an AHi of 0.3/hr and uses her oral device as well in case she takes her mask off during the night.  She uses a nasal cushion mask which she tolerates fairly well.  She has not had any dry mouth or dry nose.  She feels rested when she get up in the am.  She has lost 25lbs of weight.  She feels that she has enough pressure as well.   Past Medical History:  Diagnosis Date   DDD (degenerative disc disease), lumbosacral    Depression    HLD (hyperlipidemia)    HNP (herniated nucleus pulposus)    I5-S1   Hypertension 01-2022   Osteopenia    Rheumatic fever    Thyroid disease     Past Surgical History:  Procedure  Laterality Date   CESAREAN SECTION     NO PAST SURGERIES     UMBILICAL HERNIA REPAIR  2016    Current Medications: Current Meds  Medication Sig   irbesartan (AVAPRO) 150 MG tablet Take 150 mg by mouth daily.   liothyronine (CYTOMEL) 5 MCG tablet Take 5-10 mcg by mouth in the morning and at bedtime. 10 mg in am and 1 tablet in evening   metoprolol succinate (TOPROL XL) 25 MG 24 hr tablet Take 1 tablet (25 mg total) by mouth daily.   pantoprazole (PROTONIX) 40 MG tablet 40 mg as needed.   SYNTHROID 75 MCG tablet TAKE 1 TABLET BY MOUTH DAILY    Allergies:   Prednisone, Levothyroxine, and Propoxyphene n-acetaminophen   Social History   Socioeconomic History   Marital status: Married    Spouse name: Not on file   Number of children: Not on file   Years of education: Not on file   Highest education level: Not on file  Occupational History   Not on file  Tobacco Use   Smoking status: Never   Smokeless tobacco: Never  Substance and Sexual Activity   Alcohol use: Not Currently   Drug use: No   Sexual activity: Not Currently  Birth control/protection: Pill  Other Topics Concern   Not on file  Social History Narrative   Married   Regular exercise- yes   Dog not sick   Mom had hip fracture was caretaker but doing well.   Social Determinants of Health   Financial Resource Strain: Not on file  Food Insecurity: Not on file  Transportation Needs: Not on file  Physical Activity: Not on file  Stress: Not on file  Social Connections: Not on file     Family History:  The patient's family history includes Heart attack in her father and mother; Heart disease in her mother and another family member; Hip fracture in her mother; Sudden death in an other family member.   ROS:   Please see the history of present illness.    ROS All other systems reviewed and are negative.      No data to display             PHYSICAL EXAM:   VS:  BP 128/74   Pulse 72   Ht 5\' 5"  (1.651  m)   Wt 182 lb (82.6 kg)   BMI 30.29 kg/m    GEN: Well nourished, well developed, in no acute distress  HEENT: normal  Neck: no JVD, carotid bruits, or masses Cardiac: RRR; no murmurs, rubs, or gallops,no edema.  Intact distal pulses bilaterally.  Respiratory:  clear to auscultation bilaterally, normal work of breathing GI: soft, nontender, nondistended, + BS MS: no deformity or atrophy  Skin: warm and dry, no rash Neuro:  Alert and Oriented x 3, Strength and sensation are intact Psych: euthymic mood, full affect  Wt Readings from Last 3 Encounters:  04/28/22 182 lb (82.6 kg)  03/02/22 195 lb 12.8 oz (88.8 kg)  02/06/22 196 lb (88.9 kg)      Studies/Labs Reviewed:   Home sleep study and PAP compliance download  Recent Labs: 02/06/2022: ALT 33; B Natriuretic Peptide 15.7; Hemoglobin 14.4; Magnesium 2.1; Platelets 322; TSH 3.254 03/03/2022: BUN 17; Creatinine, Ser 0.70; Potassium 4.1; Sodium 139   Additional studies/ records that were reviewed today include:  none    ASSESSMENT:    1. OSA (obstructive sleep apnea)   2. Essential hypertension      PLAN:  In order of problems listed above:  OSA - The patient is tolerating PAP therapy well without any problems. The PAP download performed by his DME was personally reviewed and interpreted by me today and showed an AHI of 0.3/hr on 7 to 13 cm H2O but reduced compliance in using more than 4 hours nightly.  The patient has been using and benefiting from PAP use and will continue to benefit from therapy. She is also using an oral device with the CPAP. -she is not interested in the [place] device -she will continue on current setting of auto CPAP and oral device  2.  Hypertension -BP controlled on exam -Continue prescription drug management Toprol-XL 25 mg daily, irbesartan 150 mg daily with as needed refills   Time Spent: 20 minutes total time of encounter, including 15 minutes spent in face-to-face patient care on the date  of this encounter. This time includes coordination of care and counseling regarding above mentioned problem list. Remainder of non-face-to-face time involved reviewing chart documents/testing relevant to the patient encounter and documentation in the medical record. I have independently reviewed documentation from referring provider  Medication Adjustments/Labs and Tests Ordered: Current medicines are reviewed at length with the patient today.  Concerns regarding  medicines are outlined above.  Medication changes, Labs and Tests ordered today are listed in the Patient Instructions below.  There are no Patient Instructions on file for this visit.   Signed, Armanda Magic, MD  04/28/2022 2:46 PM    [facility] [address] [place], [place], [place]  [address] Phone: [phone]; Fax: [phone]

## 2022-04-29 NOTE — Telephone Encounter (Signed)
Patient settings are-Mode AutoSet Min Pressure 7 cmH2O Max Pressure 13 cmH2O EPR Fulltime  Patients dme is: DME selection is [facility]. Patient understands he will be contacted by [facility] to set up his cpap. Patient understands to call if [facility] does not contact him with new setup in a timely manner. Patient understands they will be called once confirmation has been received from [facility]/ that they have received their new machine to schedule 10 week follow up appointment.   [facility] notified of new cpap order  Please add to airview Patient was grateful for the call and thanked me.

## 2022-04-30 NOTE — Telephone Encounter (Signed)
Per Dr Mayford Knife, Order travel auto CPAP from 7-13cm H2O   Order printed and mailed to patients home per the patient.

## 2022-05-02 DIAGNOSIS — G4733 Obstructive sleep apnea (adult) (pediatric): Secondary | ICD-10-CM | POA: Diagnosis not present

## 2022-05-18 DIAGNOSIS — Z83511 Family history of glaucoma: Secondary | ICD-10-CM | POA: Diagnosis not present

## 2022-05-18 DIAGNOSIS — H40023 Open angle with borderline findings, high risk, bilateral: Secondary | ICD-10-CM | POA: Diagnosis not present

## 2022-06-01 DIAGNOSIS — G4733 Obstructive sleep apnea (adult) (pediatric): Secondary | ICD-10-CM | POA: Diagnosis not present

## 2022-06-08 DIAGNOSIS — L918 Other hypertrophic disorders of the skin: Secondary | ICD-10-CM | POA: Diagnosis not present

## 2022-06-08 DIAGNOSIS — D225 Melanocytic nevi of trunk: Secondary | ICD-10-CM | POA: Diagnosis not present

## 2022-06-08 DIAGNOSIS — L814 Other melanin hyperpigmentation: Secondary | ICD-10-CM | POA: Diagnosis not present

## 2022-06-08 DIAGNOSIS — D485 Neoplasm of uncertain behavior of skin: Secondary | ICD-10-CM | POA: Diagnosis not present

## 2022-06-08 DIAGNOSIS — L821 Other seborrheic keratosis: Secondary | ICD-10-CM | POA: Diagnosis not present

## 2022-06-08 DIAGNOSIS — D1801 Hemangioma of skin and subcutaneous tissue: Secondary | ICD-10-CM | POA: Diagnosis not present

## 2022-06-14 NOTE — Progress Notes (Deleted)
Cardiology Office Note:    Date:  06/14/2022   ID:  Taylor Lee, DOB [birth year], MRN [id number]  PCP:  Rodrigo Ran, MD  Cardiologist:  None  Electrophysiologist:  None   Referring MD: Rodrigo Ran, MD   No chief complaint on file.   History of Present Illness:    Taylor Lee is a 72 y.o. female with a hx of hyperlipidemia, hypothyroidism, ?rheumatic fever, OSA who presents for follow-up.  She was referred by Dr. Waynard Edwards for evaluation of tachycardia, initially seen 03/02/2022.  She was seen in the ED for palpitations 02/06/2022.  Workup unremarkable.  She reports she has been having intermittent palpitations for years.  Has increased in frequency recently.  About 1 month ago she woke up with palpitations and lasted minutes.  Felt like heart was fluttering.  Now occurring several times per week, typically short duration.  Denies any lightheadedness, syncope, chest pain, dyspnea, lower extremity edema.  Reports home BP has been 110s to 120s over 70s.  No smoking history.  Family history includes father died of MI at 52 and mother died of CHF at 16.  Calcium score on 09/30/2017 was 0.  Echocardiogram 03/29/2022 showed normal biventricular function, grade 1 diastolic dysfunction, mild mitral regurgitation.  Zio patch x 7 days 02/2022 showed 24 episodes of SVT with longest lasting 3 minutes with average rate 136 bpm, 1 episode of NSVT lasting 5 beats.  Since last clinic visit,  BP Readings from Last 3 Encounters:  04/28/22 128/74  03/02/22 (!) 142/82  02/06/22 (!) 151/95     Past Medical History:  Diagnosis Date   DDD (degenerative disc disease), lumbosacral    Depression    HLD (hyperlipidemia)    HNP (herniated nucleus pulposus)    I5-S1   Hypertension 01-2022   Osteopenia    Rheumatic fever    Thyroid disease     Past Surgical History:  Procedure Laterality Date   CESAREAN SECTION     NO PAST SURGERIES     UMBILICAL HERNIA REPAIR  2016    Current  Medications: No outpatient medications have been marked as taking for the 06/16/22 encounter (Appointment) with Little Ishikawa, MD.     Allergies:   Prednisone, Levothyroxine, and Propoxyphene n-acetaminophen   Social History   Socioeconomic History   Marital status: Married    Spouse name: Not on file   Number of children: Not on file   Years of education: Not on file   Highest education level: Not on file  Occupational History   Not on file  Tobacco Use   Smoking status: Never   Smokeless tobacco: Never  Substance and Sexual Activity   Alcohol use: Not Currently   Drug use: No   Sexual activity: Not Currently    Birth control/protection: Pill  Other Topics Concern   Not on file  Social History Narrative   Married   Regular exercise- yes   Dog not sick   Mom had hip fracture was caretaker but doing well.   Social Determinants of Health   Financial Resource Strain: Not on file  Food Insecurity: Not on file  Transportation Needs: Not on file  Physical Activity: Not on file  Stress: Not on file  Social Connections: Not on file     Family History: The patient's family history includes Heart attack in her father and mother; Heart disease in her mother and another family member; Hip fracture in her mother; Sudden death in an other family  member.  ROS:   Please see the history of present illness.     All other systems reviewed and are negative.  EKGs/Labs/Other Studies Reviewed:    The following studies were reviewed today:   EKG:   03/02/2022: Normal sinus rhythm, rate 83, nonspecific T wave flattening  Recent Labs: 02/06/2022: ALT 33; B Natriuretic Peptide 15.7; Hemoglobin 14.4; Magnesium 2.1; Platelets 322; TSH 3.254 03/03/2022: BUN 17; Creatinine, Ser 0.70; Potassium 4.1; Sodium 139  Recent Lipid Panel    Component Value Date/Time   CHOL 205 (HH) 06/28/2007 1003   TRIG 114 06/28/2007 1003   HDL 51.6 06/28/2007 1003   CHOLHDL 4.0 CALC 06/28/2007  1003   VLDL 23 06/28/2007 1003   LDLCALC 128 (H) 12/07/2006 0841   LDLDIRECT 143.1 06/28/2007 1003    Physical Exam:    VS:  There were no vitals taken for this visit.    Wt Readings from Last 3 Encounters:  04/28/22 182 lb (82.6 kg)  03/02/22 195 lb 12.8 oz (88.8 kg)  02/06/22 196 lb (88.9 kg)     GEN:  Well nourished, well developed in no acute distress HEENT: Normal NECK: No JVD; No carotid bruits LYMPHATICS: No lymphadenopathy CARDIAC: RRR, no murmurs, rubs, gallops RESPIRATORY:  Clear to auscultation without rales, wheezing or rhonchi  ABDOMEN: Soft, non-tender, non-distended MUSCULOSKELETAL:  No edema; No deformity  SKIN: Warm and dry NEUROLOGIC:  Alert and oriented x 3 PSYCHIATRIC:  Normal affect   ASSESSMENT:    No diagnosis found.  PLAN:    Tachycardia/palpitations: Echocardiogram 03/29/2022 showed normal biventricular function, grade 1 diastolic dysfunction, mild mitral regurgitation.  Zio patch x 7 days 02/2022 showed 24 episodes of SVT with longest lasting 3 minutes with average rate 136 bpm, 1 episode of NSVT lasting 5 beats.  ?Rheumatic fever: unclear diagnosis.  Echocardiogram 03/2022 unremarkable as above  Hypertension: on irbesartan 150 mg daily.    Hyperlipidemia: LDL 91 on 12/23/2021.  Calcium score on 09/30/2017 was 0.  OSA: Positive sleep study 02/2022, started on CPAP  RTC in 3 months   Medication Adjustments/Labs and Tests Ordered: Current medicines are reviewed at length with the patient today.  Concerns regarding medicines are outlined above.  No orders of the defined types were placed in this encounter.  No orders of the defined types were placed in this encounter.   There are no Patient Instructions on file for this visit.   Signed, Little Ishikawa, MD  06/14/2022 8:56 PM    [facility]

## 2022-06-16 ENCOUNTER — Ambulatory Visit: Payer: Medicare HMO | Admitting: Cardiology

## 2022-06-28 ENCOUNTER — Telehealth: Payer: Self-pay | Admitting: Cardiology

## 2022-06-28 NOTE — Telephone Encounter (Signed)
Patient is following-up on getting a travel CPAP machine.

## 2022-07-07 NOTE — Telephone Encounter (Signed)
Patient called again to follow-up on getting  travelling CPAP equipment.

## 2022-07-07 NOTE — Telephone Encounter (Signed)
Prescription sent 06/01/22.

## 2022-07-12 ENCOUNTER — Telehealth: Payer: Self-pay | Admitting: Cardiology

## 2022-07-12 NOTE — Telephone Encounter (Signed)
Pt calling back to f/u on prescription for traveling CPAP machine. Pt states that she has not heard anything back on this matter. Please advise.

## 2022-07-13 DIAGNOSIS — G4733 Obstructive sleep apnea (adult) (pediatric): Secondary | ICD-10-CM | POA: Diagnosis not present

## 2022-07-26 DIAGNOSIS — E039 Hypothyroidism, unspecified: Secondary | ICD-10-CM | POA: Diagnosis not present

## 2022-07-26 NOTE — Telephone Encounter (Signed)
Spoke with patient and informed her that her prescription was sent to the PO box in her chart. I have resent the Rx to her main address [place].

## 2022-08-12 DIAGNOSIS — G4733 Obstructive sleep apnea (adult) (pediatric): Secondary | ICD-10-CM | POA: Diagnosis not present

## 2022-09-05 NOTE — Progress Notes (Unsigned)
Cardiology Office Note:    Date:  09/08/2022   ID:  Taylor Lee, DOB January 20, 1950, MRN [id number]  PCP:  Rodrigo Ran, MD  Cardiologist:  Little Ishikawa, MD  Electrophysiologist:  None   Referring MD: Rodrigo Ran, MD   Chief Complaint  Patient presents with   Palpitations    History of Present Illness:    Taylor Lee is a 72 y.o. female with a hx of hyperlipidemia, hypothyroidism, ?rheumatic fever, OSA who presents for follow-up.  She was referred by Dr. Waynard Edwards for evaluation of tachycardia, initially seen 03/02/2022.  She was seen in the ED for palpitations 02/06/2022.  Workup unremarkable.  She reports she has been having intermittent palpitations for years.  Has increased in frequency recently.  About 1 month ago she woke up with palpitations and lasted minutes.  Felt like heart was fluttering.  Now occurring several times per week, typically short duration.  Denies any lightheadedness, syncope, chest pain, dyspnea, lower extremity edema.  Reports home BP has been 110s to 120s over 70s.  No smoking history.  Family history includes father died of MI at 88 and mother died of CHF at 63.  Calcium score on 09/30/2017 was 0.  Echocardiogram 03/29/2022 showed normal biventricular function, grade 1 diastolic dysfunction, mild mitral regurgitation.  Zio patch x 7 days 02/2022 showed 204 episodes of SVT with longest lasting 3 minutes with average rate 136 bpm, 1 episode of NSVT lasting 5 beats.  Since last clinic visit, she reports she is doing well.  States that palpitations have improved with starting metoprolol.  Denies any chest pain, dyspnea, lightheadedness, syncope, lower extremity edema.  Reports she monitors her heart rate with Apple Watch, has not noted any heart rates less than 50.  She is walking 3 to 4 miles per day.  She has lost 35 pounds.  Has switched to decaf coffee.   Wt Readings from Last 3 Encounters:  09/08/22 171 lb 12.8 oz (77.9 kg)  04/28/22 182 lb (82.6  kg)  03/02/22 195 lb 12.8 oz (88.8 kg)    BP Readings from Last 3 Encounters:  09/08/22 (!) 142/80  04/28/22 128/74  03/02/22 (!) 142/82     Past Medical History:  Diagnosis Date   DDD (degenerative disc disease), lumbosacral    Depression    HLD (hyperlipidemia)    HNP (herniated nucleus pulposus)    I5-S1   Hypertension 01-2022   Osteopenia    Rheumatic fever    Thyroid disease     Past Surgical History:  Procedure Laterality Date   CESAREAN SECTION     NO PAST SURGERIES     UMBILICAL HERNIA REPAIR  2016    Current Medications: Current Meds  Medication Sig   HYDROcodone-acetaminophen (NORCO/VICODIN) 5-325 MG tablet Take 1 tablet by mouth 2 (two) times daily as needed for moderate pain.   liothyronine (CYTOMEL) 5 MCG tablet Take 5-10 mcg by mouth in the morning and at bedtime. 10 mg in am and 1 tablet in evening   pantoprazole (PROTONIX) 40 MG tablet 40 mg as needed.   SYNTHROID 75 MCG tablet TAKE 1 TABLET BY MOUTH DAILY   [DISCONTINUED] irbesartan (AVAPRO) 150 MG tablet Take 150 mg by mouth daily.   [DISCONTINUED] metoprolol succinate (TOPROL XL) 25 MG 24 hr tablet Take 1 tablet (25 mg total) by mouth daily.     Allergies:   Prednisone and Levothyroxine   Social History   Socioeconomic History   Marital status: Married  Spouse name: Not on file   Number of children: Not on file   Years of education: Not on file   Highest education level: Not on file  Occupational History   Not on file  Tobacco Use   Smoking status: Never   Smokeless tobacco: Never  Substance and Sexual Activity   Alcohol use: Not Currently   Drug use: No   Sexual activity: Not Currently    Birth control/protection: Pill  Other Topics Concern   Not on file  Social History Narrative   Married   Regular exercise- yes   Dog not sick   Mom had hip fracture was caretaker but doing well.   Social Determinants of Health   Financial Resource Strain: Not on file  Food Insecurity: Not  on file  Transportation Needs: Not on file  Physical Activity: Not on file  Stress: Not on file  Social Connections: Not on file     Family History: The patient's family history includes Heart attack in her father and mother; Heart disease in her mother and another family member; Hip fracture in her mother; Sudden death in an other family member.  ROS:   Please see the history of present illness.     All other systems reviewed and are negative.  EKGs/Labs/Other Studies Reviewed:    The following studies were reviewed today:   EKG:   03/02/2022: Normal sinus rhythm, rate 83, nonspecific T wave flattening 09/08/2022: Sinus bradycardia, rate 49, low voltage  Recent Labs: 02/06/2022: ALT 33; B Natriuretic Peptide 15.7; Hemoglobin 14.4; Magnesium 2.1; Platelets 322; TSH 3.254 03/03/2022: BUN 17; Creatinine, Ser 0.70; Potassium 4.1; Sodium 139  Recent Lipid Panel    Component Value Date/Time   CHOL 205 (HH) 06/28/2007 1003   TRIG 114 06/28/2007 1003   HDL 51.6 06/28/2007 1003   CHOLHDL 4.0 CALC 06/28/2007 1003   VLDL 23 06/28/2007 1003   LDLCALC 128 (H) 12/07/2006 0841   LDLDIRECT 143.1 06/28/2007 1003    Physical Exam:    VS:  BP (!) 142/80   Pulse (!) 49   Ht 5\' 5"  (1.651 m)   Wt 171 lb 12.8 oz (77.9 kg)   SpO2 97%   BMI 28.59 kg/m     Wt Readings from Last 3 Encounters:  09/08/22 171 lb 12.8 oz (77.9 kg)  04/28/22 182 lb (82.6 kg)  03/02/22 195 lb 12.8 oz (88.8 kg)     GEN:  Well nourished, well developed in no acute distress HEENT: Normal NECK: No JVD; No carotid bruits LYMPHATICS: No lymphadenopathy CARDIAC: RRR, no murmurs, rubs, gallops RESPIRATORY:  Clear to auscultation without rales, wheezing or rhonchi  ABDOMEN: Soft, non-tender, non-distended MUSCULOSKELETAL:  No edema; No deformity  SKIN: Warm and dry NEUROLOGIC:  Alert and oriented x 3 PSYCHIATRIC:  Normal affect   ASSESSMENT:    1. Palpitations   2. SVT (supraventricular tachycardia)   3.  Essential hypertension   4. Hyperlipidemia, unspecified hyperlipidemia type   5. OSA (obstructive sleep apnea)     PLAN:    Tachycardia/palpitations: Echocardiogram 03/29/2022 showed normal biventricular function, grade 1 diastolic dysfunction, mild mitral regurgitation.  Zio patch x 7 days 02/2022 showed 204 episodes of SVT with longest lasting 3 minutes with average rate 136 bpm, 1 episode of NSVT lasting 5 beats. -Continue Toprol-XL 25 mg daily.  Will recheck ZIO x 3 days now that on metoprolol.  She does report improvement in palpitations since starting metoprolol.  Low heart rate in clinic today (49  bpm) but asymptomatic; she reports she has been monitoring her heart rate with Apple Watch, has not noted any heart rates down to 40s.  Will follow-up results of ZIO, may have to reduce metoprolol dose if having bradycardia -Suspect untreated OSA contributing to her SVT, she has started CPAP  ?Rheumatic fever: unclear diagnosis.  Echocardiogram unremarkable as above  Hypertension: on irbesartan 150 mg daily and Toprol-XL 25 mg daily.  BP elevated in clinic today, but reports has been under better control at home.  Asked to check BP twice daily for next week and let us know results  Hyperlipidemia: LDL 91 on 12/23/2021.  Calcium score on 09/30/2017 was 0.  OSA: now on CPAP, follows with Dr. Mayford Knife.  Reports compliance.   RTC in 6 months   Medication Adjustments/Labs and Tests Ordered: Current medicines are reviewed at length with the patient today.  Concerns regarding medicines are outlined above.  Orders Placed This Encounter  Procedures   LONG TERM MONITOR (3-14 DAYS)   EKG 12-Lead   Meds ordered this encounter  Medications   metoprolol succinate (TOPROL XL) 25 MG 24 hr tablet    Sig: Take 1 tablet (25 mg total) by mouth daily.    Dispense:  90 tablet    Refill:  3   irbesartan (AVAPRO) 150 MG tablet    Sig: Take 1 tablet (150 mg total) by mouth daily.    Dispense:  90 tablet     Refill:  3    Patient Instructions  Medication Instructions:  Your physician recommends that you continue on your current medications as directed. Please refer to the Current Medication list given to you today.  *If you need a refill on your cardiac medications before your next appointment, please call your pharmacy*     Follow-Up: At [facility], you and your health needs are our priority.  As part of our continuing mission to provide you with exceptional heart care, we have created designated Provider Care Teams.  These Care Teams include your primary Cardiologist (physician) and Advanced Practice Providers (APPs -  Physician Assistants and Nurse Practitioners) who all work together to provide you with the care you need, when you need it.  We recommend signing up for the patient portal called "MyChart".  Sign up information is provided on this After Visit Summary.  MyChart is used to connect with patients for Virtual Visits (Telemedicine).  Patients are able to view lab/test results, encounter notes, upcoming appointments, etc.  Non-urgent messages can be sent to your provider as well.   To learn more about what you can do with MyChart, go to [email or web address].    Your next appointment:   6 month(s): CALL IN NOVEMBER TO SCHEDULE NEXT APPOINTMENT  Provider:   Little Ishikawa, MD     Other Instructions  **Check blood pressure twice a day for one week. Record readings in log.   ZIO XT- Long Term Monitor Instructions  Your physician has requested you wear a ZIO patch monitor for 3 days.  This is a single patch monitor. [employer] supplies one patch monitor per enrollment. Additional stickers are not available. Please do not apply patch if you will be having a Nuclear Stress Test,  Echocardiogram, Cardiac CT, MRI, or Chest Xray during the period you would be wearing the  monitor. The patch cannot be worn during these tests. You cannot remove and re-apply the  ZIO  XT patch monitor.  Your ZIO patch monitor will be mailed 3 day  USPS to your address on file. It may take 3-5 days  to receive your monitor after you have been enrolled.  Once you have received your monitor, please review the enclosed instructions. Your monitor  has already been registered assigning a specific monitor serial # to you.  Billing and Patient Assistance Program Information  We have supplied [employer] with any of your insurance information on file for billing purposes. [employer] offers a sliding scale Patient Assistance Program for patients that do not have  insurance, or whose insurance does not completely cover the cost of the ZIO monitor.  You must apply for the Patient Assistance Program to qualify for this discounted rate.  To apply, please call [employer] at [phone], select option 4, select option 2, ask to apply for  Patient Assistance Program. Meredeth Ide will ask your household income, and how many people  are in your household. They will quote your out-of-pocket cost based on that information.  [employer] will also be able to set up a 34-month, interest-free payment plan if needed.  Applying the monitor   Shave hair from upper left chest.  Hold abrader disc by orange tab. Rub abrader in 40 strokes over the upper left chest as  indicated in your monitor instructions.  Clean area with 4 enclosed alcohol pads. Let dry.  Apply patch as indicated in monitor instructions. Patch will be placed under collarbone on left  side of chest with arrow pointing upward.  Rub patch adhesive wings for 2 minutes. Remove white label marked "1". Remove the white  label marked "2". Rub patch adhesive wings for 2 additional minutes.  While looking in a mirror, press and release button in center of patch. A small green light will  flash 3-4 times. This will be your only indicator that the monitor has been turned on.  Do not shower for the first 24 hours. You may shower after the first 24 hours.   Press the button if you feel a symptom. You will hear a small click. Record Date, Time and  Symptom in the Patient Logbook.  When you are ready to remove the patch, follow instructions on the last 2 pages of Patient  Logbook. Stick patch monitor onto the last page of Patient Logbook.  Place Patient Logbook in the blue and white box. Use locking tab on box and tape box closed  securely. The blue and white box has prepaid postage on it. Please place it in the mailbox as  soon as possible. Your physician should have your test results approximately 7 days after the  monitor has been mailed back to [facility].  Call [facility] Customer Care at [phone] if you have questions regarding  your ZIO XT patch monitor. Call them immediately if you see an orange light blinking on your  monitor.  If your monitor falls off in less than 4 days, contact our Monitor department at [phone].  If your monitor becomes loose or falls off after 4 days call [employer] at [phone] for  suggestions on securing your monitor    Signed, Little Ishikawa, MD  09/08/2022 1:04 PM    [facility]

## 2022-09-08 ENCOUNTER — Ambulatory Visit: Payer: Medicare HMO | Attending: Cardiology | Admitting: Cardiology

## 2022-09-08 ENCOUNTER — Ambulatory Visit: Payer: Medicare HMO | Attending: Cardiology

## 2022-09-08 VITALS — BP 142/80 | HR 49 | Ht 65.0 in | Wt 171.8 lb

## 2022-09-08 DIAGNOSIS — G4733 Obstructive sleep apnea (adult) (pediatric): Secondary | ICD-10-CM

## 2022-09-08 DIAGNOSIS — I10 Essential (primary) hypertension: Secondary | ICD-10-CM | POA: Diagnosis not present

## 2022-09-08 DIAGNOSIS — I471 Supraventricular tachycardia, unspecified: Secondary | ICD-10-CM

## 2022-09-08 DIAGNOSIS — R002 Palpitations: Secondary | ICD-10-CM | POA: Diagnosis not present

## 2022-09-08 DIAGNOSIS — L232 Allergic contact dermatitis due to cosmetics: Secondary | ICD-10-CM | POA: Diagnosis not present

## 2022-09-08 DIAGNOSIS — E785 Hyperlipidemia, unspecified: Secondary | ICD-10-CM | POA: Diagnosis not present

## 2022-09-08 MED ORDER — IRBESARTAN 150 MG PO TABS: 150.0000 mg | ORAL_TABLET | Freq: Every day | ORAL | 3 refills | Status: AC

## 2022-09-08 MED ORDER — METOPROLOL SUCCINATE ER 25 MG PO TB24: 25.0000 mg | ORAL_TABLET | Freq: Every day | ORAL | 3 refills | Status: DC

## 2022-09-08 NOTE — Progress Notes (Unsigned)
Enrolled for [employer] to mail a ZIO XT long term holter monitor to the patients address on file.  

## 2022-09-08 NOTE — Patient Instructions (Addendum)
Medication Instructions:  Your physician recommends that you continue on your current medications as directed. Please refer to the Current Medication list given to you today.  *If you need a refill on your cardiac medications before your next appointment, please call your pharmacy*     Follow-Up: At [facility], you and your health needs are our priority.  As part of our continuing mission to provide you with exceptional heart care, we have created designated Provider Care Teams.  These Care Teams include your primary Cardiologist (physician) and Advanced Practice Providers (APPs -  Physician Assistants and Nurse Practitioners) who all work together to provide you with the care you need, when you need it.  We recommend signing up for the patient portal called "MyChart".  Sign up information is provided on this After Visit Summary.  MyChart is used to connect with patients for Virtual Visits (Telemedicine).  Patients are able to view lab/test results, encounter notes, upcoming appointments, etc.  Non-urgent messages can be sent to your provider as well.   To learn more about what you can do with MyChart, go to [email or web address].    Your next appointment:   6 month(s): CALL IN NOVEMBER TO SCHEDULE NEXT APPOINTMENT  Provider:   Little Ishikawa, MD     Other Instructions  **Check blood pressure twice a day for one week. Record readings in log.   ZIO XT- Long Term Monitor Instructions  Your physician has requested you wear a ZIO patch monitor for 3 days.  This is a single patch monitor. [employer] supplies one patch monitor per enrollment. Additional stickers are not available. Please do not apply patch if you will be having a Nuclear Stress Test,  Echocardiogram, Cardiac CT, MRI, or Chest Xray during the period you would be wearing the  monitor. The patch cannot be worn during these tests. You cannot remove and re-apply the  ZIO XT patch monitor.  Your ZIO patch  monitor will be mailed 3 day USPS to your address on file. It may take 3-5 days  to receive your monitor after you have been enrolled.  Once you have received your monitor, please review the enclosed instructions. Your monitor  has already been registered assigning a specific monitor serial # to you.  Billing and Patient Assistance Program Information  We have supplied [employer] with any of your insurance information on file for billing purposes. [employer] offers a sliding scale Patient Assistance Program for patients that do not have  insurance, or whose insurance does not completely cover the cost of the ZIO monitor.  You must apply for the Patient Assistance Program to qualify for this discounted rate.  To apply, please call [employer] at [phone], select option 4, select option 2, ask to apply for  Patient Assistance Program. Meredeth Ide will ask your household income, and how many people  are in your household. They will quote your out-of-pocket cost based on that information.  [employer] will also be able to set up a 45-month, interest-free payment plan if needed.  Applying the monitor   Shave hair from upper left chest.  Hold abrader disc by orange tab. Rub abrader in 40 strokes over the upper left chest as  indicated in your monitor instructions.  Clean area with 4 enclosed alcohol pads. Let dry.  Apply patch as indicated in monitor instructions. Patch will be placed under collarbone on left  side of chest with arrow pointing upward.  Rub patch adhesive wings for 2 minutes. Remove white label marked "  1". Remove the white  label marked "2". Rub patch adhesive wings for 2 additional minutes.  While looking in a mirror, press and release button in center of patch. A small green light will  flash 3-4 times. This will be your only indicator that the monitor has been turned on.  Do not shower for the first 24 hours. You may shower after the first 24 hours.  Press the button if you feel a  symptom. You will hear a small click. Record Date, Time and  Symptom in the Patient Logbook.  When you are ready to remove the patch, follow instructions on the last 2 pages of Patient  Logbook. Stick patch monitor onto the last page of Patient Logbook.  Place Patient Logbook in the blue and white box. Use locking tab on box and tape box closed  securely. The blue and white box has prepaid postage on it. Please place it in the mailbox as  soon as possible. Your physician should have your test results approximately 7 days after the  monitor has been mailed back to [facility].  Call [facility] Customer Care at [phone] if you have questions regarding  your ZIO XT patch monitor. Call them immediately if you see an orange light blinking on your  monitor.  If your monitor falls off in less than 4 days, contact our Monitor department at [phone].  If your monitor becomes loose or falls off after 4 days call [employer] at [phone] for  suggestions on securing your monitor

## 2022-09-12 DIAGNOSIS — G4733 Obstructive sleep apnea (adult) (pediatric): Secondary | ICD-10-CM | POA: Diagnosis not present

## 2022-09-16 DIAGNOSIS — I471 Supraventricular tachycardia, unspecified: Secondary | ICD-10-CM

## 2022-09-27 ENCOUNTER — Telehealth: Payer: Self-pay | Admitting: Cardiology

## 2022-09-27 DIAGNOSIS — E1169 Type 2 diabetes mellitus with other specified complication: Secondary | ICD-10-CM | POA: Diagnosis not present

## 2022-09-27 DIAGNOSIS — G4733 Obstructive sleep apnea (adult) (pediatric): Secondary | ICD-10-CM | POA: Diagnosis not present

## 2022-09-27 DIAGNOSIS — I700 Atherosclerosis of aorta: Secondary | ICD-10-CM | POA: Diagnosis not present

## 2022-09-27 DIAGNOSIS — E039 Hypothyroidism, unspecified: Secondary | ICD-10-CM | POA: Diagnosis not present

## 2022-09-27 DIAGNOSIS — R000 Tachycardia, unspecified: Secondary | ICD-10-CM | POA: Diagnosis not present

## 2022-09-27 DIAGNOSIS — I471 Supraventricular tachycardia, unspecified: Secondary | ICD-10-CM | POA: Diagnosis not present

## 2022-09-27 DIAGNOSIS — R002 Palpitations: Secondary | ICD-10-CM | POA: Diagnosis not present

## 2022-09-27 DIAGNOSIS — E669 Obesity, unspecified: Secondary | ICD-10-CM | POA: Diagnosis not present

## 2022-09-27 DIAGNOSIS — I4719 Other supraventricular tachycardia: Secondary | ICD-10-CM | POA: Diagnosis not present

## 2022-09-27 DIAGNOSIS — I10 Essential (primary) hypertension: Secondary | ICD-10-CM | POA: Diagnosis not present

## 2022-09-27 NOTE — Telephone Encounter (Signed)
Routed to Cheryl LPN 

## 2022-09-27 NOTE — Telephone Encounter (Signed)
Paper Work Dropped Off: Blood pressure   Date:09/09//2024  Location of paper: Emergency planning/management officer

## 2022-09-28 NOTE — Telephone Encounter (Signed)
B/P readings given to Dr.Schumann.

## 2022-10-11 DIAGNOSIS — G4733 Obstructive sleep apnea (adult) (pediatric): Secondary | ICD-10-CM | POA: Diagnosis not present

## 2022-11-18 DIAGNOSIS — H2513 Age-related nuclear cataract, bilateral: Secondary | ICD-10-CM | POA: Diagnosis not present

## 2022-11-18 DIAGNOSIS — H02831 Dermatochalasis of right upper eyelid: Secondary | ICD-10-CM | POA: Diagnosis not present

## 2022-11-18 DIAGNOSIS — H524 Presbyopia: Secondary | ICD-10-CM | POA: Diagnosis not present

## 2022-11-18 DIAGNOSIS — R7303 Prediabetes: Secondary | ICD-10-CM | POA: Diagnosis not present

## 2022-11-18 DIAGNOSIS — H25013 Cortical age-related cataract, bilateral: Secondary | ICD-10-CM | POA: Diagnosis not present

## 2022-11-18 DIAGNOSIS — H5203 Hypermetropia, bilateral: Secondary | ICD-10-CM | POA: Diagnosis not present

## 2022-11-18 DIAGNOSIS — H02834 Dermatochalasis of left upper eyelid: Secondary | ICD-10-CM | POA: Diagnosis not present

## 2022-11-18 DIAGNOSIS — H43393 Other vitreous opacities, bilateral: Secondary | ICD-10-CM | POA: Diagnosis not present

## 2022-11-18 DIAGNOSIS — H40023 Open angle with borderline findings, high risk, bilateral: Secondary | ICD-10-CM | POA: Diagnosis not present

## 2022-11-18 DIAGNOSIS — H52203 Unspecified astigmatism, bilateral: Secondary | ICD-10-CM | POA: Diagnosis not present

## 2022-12-10 DIAGNOSIS — L814 Other melanin hyperpigmentation: Secondary | ICD-10-CM | POA: Diagnosis not present

## 2022-12-10 DIAGNOSIS — L918 Other hypertrophic disorders of the skin: Secondary | ICD-10-CM | POA: Diagnosis not present

## 2022-12-10 DIAGNOSIS — D225 Melanocytic nevi of trunk: Secondary | ICD-10-CM | POA: Diagnosis not present

## 2022-12-10 DIAGNOSIS — D1801 Hemangioma of skin and subcutaneous tissue: Secondary | ICD-10-CM | POA: Diagnosis not present

## 2022-12-10 DIAGNOSIS — L821 Other seborrheic keratosis: Secondary | ICD-10-CM | POA: Diagnosis not present

## 2023-01-10 DIAGNOSIS — G4733 Obstructive sleep apnea (adult) (pediatric): Secondary | ICD-10-CM | POA: Diagnosis not present

## 2023-02-23 ENCOUNTER — Ambulatory Visit: Payer: Medicare HMO | Attending: Cardiology | Admitting: Cardiology

## 2023-02-23 VITALS — BP 149/76 | HR 59 | Ht 65.0 in | Wt 171.8 lb

## 2023-02-23 DIAGNOSIS — R002 Palpitations: Secondary | ICD-10-CM

## 2023-02-23 DIAGNOSIS — I10 Essential (primary) hypertension: Secondary | ICD-10-CM | POA: Diagnosis not present

## 2023-02-23 DIAGNOSIS — E785 Hyperlipidemia, unspecified: Secondary | ICD-10-CM

## 2023-02-23 LAB — BASIC METABOLIC PANEL
BUN/Creatinine Ratio: 26 (ref 12–28)
BUN: 18 mg/dL (ref 8–27)
CO2: 21 mmol/L (ref 20–29)
Calcium: 9.7 mg/dL (ref 8.7–10.3)
Chloride: 105 mmol/L (ref 96–106)
Creatinine, Ser: 0.7 mg/dL (ref 0.57–1.00)
Glucose: 102 mg/dL — ABNORMAL HIGH (ref 70–99)
Potassium: 4.8 mmol/L (ref 3.5–5.2)
Sodium: 141 mmol/L (ref 134–144)
eGFR: 91 mL/min/{1.73_m2} (ref >59)

## 2023-02-23 LAB — LIPID PANEL
Chol/HDL Ratio: 3 {ratio} (ref 0.0–4.4)
Cholesterol, Total: 185 mg/dL (ref 100–199)
HDL: 62 mg/dL (ref >39)
LDL Chol Calc (NIH): 105 mg/dL — ABNORMAL HIGH (ref 0–99)
Triglycerides: 101 mg/dL (ref 0–149)
VLDL Cholesterol Cal: 18 mg/dL (ref 5–40)

## 2023-02-23 NOTE — Progress Notes (Signed)
 Cardiology Office Note:    Date:  02/23/2023   ID:  ARDELIA WREDE, DOB 1950/02/12, MRN [id number]  PCP:  Shayne Anes, MD  Cardiologist:  Lonni LITTIE Nanas, MD  Electrophysiologist:  None   Referring MD: Shayne Anes, MD   Chief Complaint  Patient presents with   Palpitations    History of Present Illness:    Taylor Lee is a 73 y.o. female with a hx of hyperlipidemia, hypothyroidism, ?rheumatic fever, OSA who presents for follow-up.  She was referred by Dr. Shayne for evaluation of tachycardia, initially seen 03/02/2022.  She was seen in the ED for palpitations 02/06/2022.  Workup unremarkable.  She reports she has been having intermittent palpitations for years.  Has increased in frequency recently.  About 1 month ago she woke up with palpitations and lasted minutes.  Felt like heart was fluttering.  Now occurring several times per week, typically short duration.  Denies any lightheadedness, syncope, chest pain, dyspnea, lower extremity edema.  Reports home BP has been 110s to 120s over 70s.  No smoking history.  Family history includes father died of MI at 60 and mother died of CHF at 9.  Calcium score on 09/30/2017 was 0.  Echocardiogram 03/29/2022 showed normal biventricular function, grade 1 diastolic dysfunction, mild mitral regurgitation.  Zio patch x 7 days 02/2022 showed 204 episodes of SVT with longest lasting 3 minutes with average rate 136 bpm, 1 episode of NSVT lasting 5 beats.  Zio patch x 3 days 08/2022 showed 291 episodes of SVT with longest lasting 6 minutes with average rate 135 bpm.  Since last clinic visit, she reports she is doing okay.  She is having palpitations couple times per week but just lasting for few seconds.  Denies any chest pain, dyspnea, lightheadedness, syncope, lower extremity edema.  Was walking 3-5 miles per day, had to stop in last few weeks due to back pain.  Has lost almost 40 lbs.  Reports compliance with CPAP.   Wt Readings from Last 3  Encounters:  02/23/23 171 lb 12.8 oz (77.9 kg)  09/08/22 171 lb 12.8 oz (77.9 kg)  04/28/22 182 lb (82.6 kg)    BP Readings from Last 3 Encounters:  02/23/23 (!) 149/76  09/08/22 (!) 142/80  04/28/22 128/74     Past Medical History:  Diagnosis Date   DDD (degenerative disc disease), lumbosacral    Depression    HLD (hyperlipidemia)    HNP (herniated nucleus pulposus)    I5-S1   Hypertension 01-2022   Osteopenia    Rheumatic fever    Thyroid  disease     Past Surgical History:  Procedure Laterality Date   CESAREAN SECTION     NO PAST SURGERIES     UMBILICAL HERNIA REPAIR  2016    Current Medications: Current Meds  Medication Sig   HYDROcodone -acetaminophen  (NORCO/VICODIN) 5-325 MG tablet Take 1 tablet by mouth 2 (two) times daily as needed for moderate pain.   irbesartan  (AVAPRO ) 150 MG tablet Take 1 tablet (150 mg total) by mouth daily.   liothyronine (CYTOMEL) 5 MCG tablet Take 5-10 mcg by mouth in the morning and at bedtime. 10 mg in am and 1 tablet in evening   metoprolol  succinate (TOPROL  XL) 25 MG 24 hr tablet Take 1 tablet (25 mg total) by mouth daily.   SYNTHROID  75 MCG tablet TAKE 1 TABLET BY MOUTH DAILY     Allergies:   Prednisone and Levothyroxine    Social History   Socioeconomic History  Marital status: Married    Spouse name: Not on file   Number of children: Not on file   Years of education: Not on file   Highest education level: Not on file  Occupational History   Not on file  Tobacco Use   Smoking status: Never   Smokeless tobacco: Never  Substance and Sexual Activity   Alcohol  use: Not Currently   Drug use: No   Sexual activity: Not Currently    Birth control/protection: Pill  Other Topics Concern   Not on file  Social History Narrative   Married   Regular exercise- yes   Dog not sick   Mom had hip fracture was caretaker but doing well.   Social Drivers of Corporate Investment Banker Strain: Not on file  Food Insecurity: Not on  file  Transportation Needs: Not on file  Physical Activity: Not on file  Stress: Not on file  Social Connections: Not on file     Family History: The patient's family history includes Heart attack in her father and mother; Heart disease in her mother and another family member; Hip fracture in her mother; Sudden death in an other family member.  ROS:   Please see the history of present illness.     All other systems reviewed and are negative.  EKGs/Labs/Other Studies Reviewed:    The following studies were reviewed today:   EKG:   03/02/2022: Normal sinus rhythm, rate 83, nonspecific T wave flattening 09/08/2022: Sinus bradycardia, rate 49, low voltage 02/23/23: Sinus bradycardia, rate 59, no ST abnormalities  Recent Labs: 03/03/2022: BUN 17; Creatinine, Ser 0.70; Potassium 4.1; Sodium 139  Recent Lipid Panel    Component Value Date/Time   CHOL 205 (HH) 06/28/2007 1003   TRIG 114 06/28/2007 1003   HDL 51.6 06/28/2007 1003   CHOLHDL 4.0 CALC 06/28/2007 1003   VLDL 23 06/28/2007 1003   LDLCALC 128 (H) 12/07/2006 0841   LDLDIRECT 143.1 06/28/2007 1003    Physical Exam:    VS:  BP (!) 149/76 (BP Location: Left Arm, Patient Position: Sitting)   Pulse (!) 59   Ht 5' 5 (1.651 m)   Wt 171 lb 12.8 oz (77.9 kg)   SpO2 99%   BMI 28.59 kg/m     Wt Readings from Last 3 Encounters:  02/23/23 171 lb 12.8 oz (77.9 kg)  09/08/22 171 lb 12.8 oz (77.9 kg)  04/28/22 182 lb (82.6 kg)     GEN:  Well nourished, well developed in no acute distress HEENT: Normal NECK: No JVD; No carotid bruits LYMPHATICS: No lymphadenopathy CARDIAC: RRR, no murmurs, rubs, gallops RESPIRATORY:  Clear to auscultation without rales, wheezing or rhonchi  ABDOMEN: Soft, non-tender, non-distended MUSCULOSKELETAL:  No edema; No deformity  SKIN: Warm and dry NEUROLOGIC:  Alert and oriented x 3 PSYCHIATRIC:  Normal affect   ASSESSMENT:    1. Palpitations   2. Essential hypertension   3. Hyperlipidemia,  unspecified hyperlipidemia type      PLAN:    Tachycardia/palpitations: Echocardiogram 03/29/2022 showed normal biventricular function, grade 1 diastolic dysfunction, mild mitral regurgitation.  Zio patch x 7 days 02/2022 showed 204 episodes of SVT with longest lasting 3 minutes with average rate 136 bpm, 1 episode of NSVT lasting 5 beats.  Started Toprol -XL 25 mg daily.  Zio patch x 3 days 08/2022 showed 291 episodes of SVT with longest lasting 6 minutes with average rate 135 bpm. -Continue Toprol -XL 25 mg daily.  Reports palpitations have improved -Suspect untreated  OSA contributing to her SVT, she has started CPAP  ?Rheumatic fever: unclear diagnosis.  Echocardiogram unremarkable as above  Hypertension: on irbesartan  150 mg daily and Toprol -XL 25 mg daily.  BP elevated in clinic today, but reports having back pain for which she is currently undergoing treatment.  Recommend checking BP twice daily for next week and letting us  know results.  Check BMET  Hyperlipidemia: LDL 91 on 12/23/2021.  Calcium score on 09/30/2017 was 0.  Check lipid panel  OSA: now on CPAP, follows with Dr. Shlomo.  Reports compliance.   RTC in 1 year   Medication Adjustments/Labs and Tests Ordered: Current medicines are reviewed at length with the patient today.  Concerns regarding medicines are outlined above.  Orders Placed This Encounter  Procedures   Basic Metabolic Panel (BMET)   Lipid panel   EKG 12-Lead   No orders of the defined types were placed in this encounter.   Patient Instructions  Medication Instructions:  Continue current medication *If you need a refill on your cardiac medications before your next appointment, please call your pharmacy*   Lab Work: Bmet, lipid panel today If you have labs (blood work) drawn today and your tests are completely normal, you will receive your results only by: MyChart Message (if you have MyChart) OR A paper copy in the mail If you have any lab test that is  abnormal or we need to change your treatment, we will call you to review the results.   Testing/Procedures: none   Follow-Up: At [facility], you and your health needs are our priority.  As part of our continuing mission to provide you with exceptional heart care, we have created designated Provider Care Teams.  These Care Teams include your primary Cardiologist (physician) and Advanced Practice Providers (APPs -  Physician Assistants and Nurse Practitioners) who all work together to provide you with the care you need, when you need it.  We recommend signing up for the patient portal called MyChart.  Sign up information is provided on this After Visit Summary.  MyChart is used to connect with patients for Virtual Visits (Telemedicine).  Patients are able to view lab/test results, encounter notes, upcoming appointments, etc.  Non-urgent messages can be sent to your provider as well.   To learn more about what you can do with MyChart, go to [email or web address].    Your next appointment:   1 year(s)  Provider:   Lonni LITTIE Nanas, MD     Other Instructions Please purchase Omron blood pressure and check blood pressure twice a day for next week and send via mychart         Signed, Lonni LITTIE Nanas, MD  02/23/2023 8:39 AM    [facility]

## 2023-02-23 NOTE — Patient Instructions (Signed)
 Medication Instructions:  Continue current medication *If you need a refill on your cardiac medications before your next appointment, please call your pharmacy*   Lab Work: Bmet, lipid panel today If you have labs (blood work) drawn today and your tests are completely normal, you will receive your results only by: MyChart Message (if you have MyChart) OR A paper copy in the mail If you have any lab test that is abnormal or we need to change your treatment, we will call you to review the results.   Testing/Procedures: none   Follow-Up: At [facility], you and your health needs are our priority.  As part of our continuing mission to provide you with exceptional heart care, we have created designated Provider Care Teams.  These Care Teams include your primary Cardiologist (physician) and Advanced Practice Providers (APPs -  Physician Assistants and Nurse Practitioners) who all work together to provide you with the care you need, when you need it.  We recommend signing up for the patient portal called MyChart.  Sign up information is provided on this After Visit Summary.  MyChart is used to connect with patients for Virtual Visits (Telemedicine).  Patients are able to view lab/test results, encounter notes, upcoming appointments, etc.  Non-urgent messages can be sent to your provider as well.   To learn more about what you can do with MyChart, go to [email or web address].    Your next appointment:   1 year(s)  Provider:   Lonni LITTIE Nanas, MD     Other Instructions Please purchase Omron blood pressure and check blood pressure twice a day for next week and send via mychart

## 2023-02-24 ENCOUNTER — Encounter: Payer: Self-pay | Admitting: *Deleted

## 2023-03-14 DIAGNOSIS — E1169 Type 2 diabetes mellitus with other specified complication: Secondary | ICD-10-CM | POA: Diagnosis not present

## 2023-03-14 DIAGNOSIS — E7870 Disorder of bile acid and cholesterol metabolism, unspecified: Secondary | ICD-10-CM | POA: Diagnosis not present

## 2023-03-14 DIAGNOSIS — E039 Hypothyroidism, unspecified: Secondary | ICD-10-CM | POA: Diagnosis not present

## 2023-03-14 DIAGNOSIS — I10 Essential (primary) hypertension: Secondary | ICD-10-CM | POA: Diagnosis not present

## 2023-03-21 DIAGNOSIS — R82998 Other abnormal findings in urine: Secondary | ICD-10-CM | POA: Diagnosis not present

## 2023-03-21 DIAGNOSIS — F3289 Other specified depressive episodes: Secondary | ICD-10-CM | POA: Diagnosis not present

## 2023-03-21 DIAGNOSIS — M5136 Other intervertebral disc degeneration, lumbar region with discogenic back pain only: Secondary | ICD-10-CM | POA: Diagnosis not present

## 2023-03-21 DIAGNOSIS — Z0000 Encounter for general adult medical examination without abnormal findings: Secondary | ICD-10-CM | POA: Diagnosis not present

## 2023-03-21 DIAGNOSIS — E7870 Disorder of bile acid and cholesterol metabolism, unspecified: Secondary | ICD-10-CM | POA: Diagnosis not present

## 2023-03-21 DIAGNOSIS — I10 Essential (primary) hypertension: Secondary | ICD-10-CM | POA: Diagnosis not present

## 2023-03-21 DIAGNOSIS — K429 Umbilical hernia without obstruction or gangrene: Secondary | ICD-10-CM | POA: Diagnosis not present

## 2023-03-21 DIAGNOSIS — I700 Atherosclerosis of aorta: Secondary | ICD-10-CM | POA: Diagnosis not present

## 2023-03-21 DIAGNOSIS — E1169 Type 2 diabetes mellitus with other specified complication: Secondary | ICD-10-CM | POA: Diagnosis not present

## 2023-03-21 DIAGNOSIS — E669 Obesity, unspecified: Secondary | ICD-10-CM | POA: Diagnosis not present

## 2023-03-21 DIAGNOSIS — E039 Hypothyroidism, unspecified: Secondary | ICD-10-CM | POA: Diagnosis not present

## 2023-03-21 DIAGNOSIS — G4733 Obstructive sleep apnea (adult) (pediatric): Secondary | ICD-10-CM | POA: Diagnosis not present

## 2023-03-22 ENCOUNTER — Other Ambulatory Visit: Payer: Self-pay | Admitting: Cardiology

## 2023-03-23 ENCOUNTER — Telehealth: Payer: Self-pay | Admitting: Cardiology

## 2023-03-23 NOTE — Telephone Encounter (Signed)
 Patient brought in BP reading. Will leave in provider box.

## 2023-03-28 DIAGNOSIS — Z1231 Encounter for screening mammogram for malignant neoplasm of breast: Secondary | ICD-10-CM | POA: Diagnosis not present

## 2023-03-30 ENCOUNTER — Telehealth: Payer: Self-pay | Admitting: Cardiology

## 2023-03-30 MED ORDER — METOPROLOL SUCCINATE ER 25 MG PO TB24: 25.0000 mg | ORAL_TABLET | Freq: Every day | ORAL | 3 refills | Status: AC

## 2023-03-30 NOTE — Telephone Encounter (Signed)
 Pt's medication was sent to pt's pharmacy as requested. Confirmation received.

## 2023-03-30 NOTE — Telephone Encounter (Signed)
*  STAT* If patient is at the pharmacy, call can be transferred to refill team.   1. Which medications need to be refilled? (please list name of each medication and dose if known) metoprolol succinate (TOPROL XL) 25 MG 24 hr tablet    2. Would you like to learn more about the convenience, safety, & potential cost savings by using the [facility]?      3. Are you open to using the [facility] (Type [facility]. ).   4. Which pharmacy/location (including street and city if local pharmacy) is medication to be sent to? [facility] [id number] - [place], [place] - [address]    5. Do they need a 30 day or 90 day supply? 90

## 2023-03-31 ENCOUNTER — Telehealth: Payer: Self-pay | Admitting: *Deleted

## 2023-03-31 NOTE — Telephone Encounter (Signed)
 Called and spoke to pt regarding her BP log that was dropped off at [facility] office.  Per Dr. Bjorn Pippin, no changes and continue current medications/plan.  Pt verbalized understanding; no further concerns.

## 2023-04-06 DIAGNOSIS — J189 Pneumonia, unspecified organism: Secondary | ICD-10-CM | POA: Diagnosis not present

## 2023-04-06 DIAGNOSIS — R062 Wheezing: Secondary | ICD-10-CM | POA: Diagnosis not present

## 2023-04-06 DIAGNOSIS — R051 Acute cough: Secondary | ICD-10-CM | POA: Diagnosis not present

## 2023-04-06 DIAGNOSIS — I10 Essential (primary) hypertension: Secondary | ICD-10-CM | POA: Diagnosis not present

## 2023-04-06 DIAGNOSIS — E1169 Type 2 diabetes mellitus with other specified complication: Secondary | ICD-10-CM | POA: Diagnosis not present

## 2023-04-06 DIAGNOSIS — Z1152 Encounter for screening for COVID-19: Secondary | ICD-10-CM | POA: Diagnosis not present

## 2023-04-06 DIAGNOSIS — R9389 Abnormal findings on diagnostic imaging of other specified body structures: Secondary | ICD-10-CM | POA: Diagnosis not present

## 2023-04-06 DIAGNOSIS — R509 Fever, unspecified: Secondary | ICD-10-CM | POA: Diagnosis not present

## 2023-04-06 DIAGNOSIS — R5383 Other fatigue: Secondary | ICD-10-CM | POA: Diagnosis not present

## 2023-04-10 DIAGNOSIS — G4733 Obstructive sleep apnea (adult) (pediatric): Secondary | ICD-10-CM | POA: Diagnosis not present

## 2023-05-09 DIAGNOSIS — J189 Pneumonia, unspecified organism: Secondary | ICD-10-CM | POA: Diagnosis not present

## 2023-05-23 DIAGNOSIS — H5213 Myopia, bilateral: Secondary | ICD-10-CM | POA: Diagnosis not present

## 2023-05-23 DIAGNOSIS — H47393 Other disorders of optic disc, bilateral: Secondary | ICD-10-CM | POA: Diagnosis not present

## 2023-05-23 DIAGNOSIS — Z0100 Encounter for examination of eyes and vision without abnormal findings: Secondary | ICD-10-CM | POA: Diagnosis not present

## 2023-05-23 DIAGNOSIS — H40023 Open angle with borderline findings, high risk, bilateral: Secondary | ICD-10-CM | POA: Diagnosis not present

## 2023-07-09 DIAGNOSIS — G4733 Obstructive sleep apnea (adult) (pediatric): Secondary | ICD-10-CM | POA: Diagnosis not present

## 2023-07-12 ENCOUNTER — Encounter: Payer: Self-pay | Admitting: *Deleted

## 2023-07-14 ENCOUNTER — Encounter: Payer: Self-pay | Admitting: Cardiology

## 2023-07-14 ENCOUNTER — Ambulatory Visit: Attending: Cardiology | Admitting: Cardiology

## 2023-07-14 ENCOUNTER — Telehealth: Payer: Self-pay | Admitting: Radiology

## 2023-07-14 VITALS — BP 138/78 | HR 56 | Ht 65.0 in | Wt 179.4 lb

## 2023-07-14 DIAGNOSIS — I10 Essential (primary) hypertension: Secondary | ICD-10-CM | POA: Diagnosis not present

## 2023-07-14 DIAGNOSIS — G4733 Obstructive sleep apnea (adult) (pediatric): Secondary | ICD-10-CM | POA: Diagnosis not present

## 2023-07-14 NOTE — Telephone Encounter (Signed)
 Patient agreement reviewed and signed on 07/14/2023.  WatchPAT issued to patient on 07/14/2023 by Woodie LOISE Prudent. Patient aware to not open the WatchPAT box until contacted with the activation PIN. Patient profile initialized in CloudPAT on 07/14/2023 by Rockie RAMAN. Device serial number: [id number]  Please list Reason for Call as Advice Only and type WatchPAT issued to patient in the comment box.

## 2023-07-14 NOTE — Patient Instructions (Signed)
 Medication Instructions:  Your physician recommends that you continue on your current medications as directed. Please refer to the Current Medication list given to you today.  *If you need a refill on your cardiac medications before your next appointment, please call your pharmacy*  Lab Work: None.  If you have labs (blood work) drawn today and your tests are completely normal, you will receive your results only by: MyChart Message (if you have MyChart) OR A paper copy in the mail If you have any lab test that is abnormal or we need to change your treatment, we will call you to review the results.  Testing/Procedures: Your physician has recommended that you have a home sleep study. Please wear your oral device while performing your home sleep study, do not use your cpap device. This test records several body functions during sleep, including: brain activity, eye movement, oxygen and carbon dioxide blood levels, heart rate and rhythm, breathing rate and rhythm, the flow of air through your mouth and nose, snoring, body muscle movements, and chest and belly movement.   Follow-Up: At [facility], you and your health needs are our priority.  As part of our continuing mission to provide you with exceptional heart care, our providers are all part of one team.  This team includes your primary Cardiologist (physician) and Advanced Practice Providers or APPs (Physician Assistants and Nurse Practitioners) who all work together to provide you with the care you need, when you need it.  Your next appointment:   1 year(s)  Provider:   Dr. Wilbert Bihari, MD

## 2023-07-14 NOTE — Addendum Note (Signed)
 Addended by: JANIT GENI CROME on: 07/14/2023 10:56 AM   Modules accepted: Orders

## 2023-07-14 NOTE — Progress Notes (Signed)
 Sleep Medicine Note    Date:  07/14/2023   ID:  Taylor Lee, DOB [birth year], MRN [id number]  PCP:  Shayne Anes, MD  Cardiologist: Lonni Nanas, MD  Chief Complaint  Patient presents with   Sleep Apnea   Hypertension    History of Present Illness:  Taylor Lee is a 73 y.o. female  with a history of hyperlipidemia, hypertension, rheumatic fever and thyroid  disease.  She was seen by Dr. Nanas on 03/02/2022 at which time she told Dr. Victor she had a history of sleep apnea in the past and had been on CPAP but was unable to tolerate it.  At that time she was using an oral appliance.  She had not been following anyone with sleep medicine.  She underwent a home sleep study showing mild obstructive sleep apnea with an AHI of 14.5/h overall severe obstructive sleep apnea during REM sleep with a REM AHI of 34.9/h with nocturnal hypoxemia with O2 sats less than 88% for at least 26 minutes and an O2 sat nadir of 78%.  Events occurred in supine and  nonsupine positions.    She has started back on her CPAP that she was on before.  She is on a current setting of auto CPAP 7-13cm H2O with an AHi of 0.3/hr and uses her oral device as well in case she takes her mask off during the night.  She tolerates the mask and feels the pressure is adequate.  Since going on PAP she feels rested in the am and has no significant daytime sleepiness.if she slept well the night before.  She denies any significant  nasal dryness or nasal congestion.  She has mouth dryness and does not use a chin strap. She does not think that she snores.  She has lost 30lbs and wants to see if she still has OSA as her sleep watch says that she does not have OSA.   Past Medical History:  Diagnosis Date   DDD (degenerative disc disease), lumbosacral    Depression    HLD (hyperlipidemia)    HNP (herniated nucleus pulposus)    I5-S1   Hypertension 01-2022   Osteopenia    Rheumatic fever    Thyroid  disease      Past Surgical History:  Procedure Laterality Date   CESAREAN SECTION     NO PAST SURGERIES     UMBILICAL HERNIA REPAIR  2016    Current Medications: Current Meds  Medication Sig   HYDROcodone -acetaminophen  (NORCO/VICODIN) 5-325 MG tablet Take 1 tablet by mouth 2 (two) times daily as needed for moderate pain.   irbesartan  (AVAPRO ) 150 MG tablet Take 1 tablet (150 mg total) by mouth daily.   liothyronine (CYTOMEL) 5 MCG tablet Take 5-10 mcg by mouth in the morning and at bedtime. 10 mg in am and 1 tablet in evening   metoprolol  succinate (TOPROL  XL) 25 MG 24 hr tablet Take 1 tablet (25 mg total) by mouth daily.   SYNTHROID  75 MCG tablet TAKE 1 TABLET BY MOUTH DAILY    Allergies:   Prednisone and Levothyroxine    Social History   Socioeconomic History   Marital status: Married    Spouse name: Not on file   Number of children: Not on file   Years of education: Not on file   Highest education level: Not on file  Occupational History   Not on file  Tobacco Use   Smoking status: Never   Smokeless tobacco: Never  Substance and  Sexual Activity   Alcohol  use: Not Currently   Drug use: No   Sexual activity: Not Currently    Birth control/protection: Pill  Other Topics Concern   Not on file  Social History Narrative   Married   Regular exercise- yes   Dog not sick   Mom had hip fracture was caretaker but doing well.   Social Drivers of Corporate investment banker Strain: Not on file  Food Insecurity: Not on file  Transportation Needs: Not on file  Physical Activity: Not on file  Stress: Not on file  Social Connections: Not on file     Family History:  The patient's family history includes Heart attack in her father and mother; Heart disease in her mother and another family member; Hip fracture in her mother; Sudden death in an other family member.   ROS:   Please see the history of present illness.    ROS All other systems reviewed and are negative.      No data  to display             PHYSICAL EXAM:   VS:  BP 138/78   Pulse (!) 56   Ht 5' 5 (1.651 m)   Wt 179 lb 6.4 oz (81.4 kg)   SpO2 99%   BMI 29.85 kg/m    GEN: Well nourished, well developed in no acute distress HEENT: Normal NECK: No JVD; No carotid bruits LYMPHATICS: No lymphadenopathy CARDIAC:RRR, no murmurs, rubs, gallops RESPIRATORY:  Clear to auscultation without rales, wheezing or rhonchi  ABDOMEN: Soft, non-tender, non-distended MUSCULOSKELETAL:  No edema; No deformity  SKIN: Warm and dry NEUROLOGIC:  Alert and oriented x 3 PSYCHIATRIC:  Normal affect  Wt Readings from Last 3 Encounters:  07/14/23 179 lb 6.4 oz (81.4 kg)  02/23/23 171 lb 12.8 oz (77.9 kg)  09/08/22 171 lb 12.8 oz (77.9 kg)      Studies/Labs Reviewed:   Home sleep study and PAP compliance download  Recent Labs: 02/23/2023: BUN 18; Creatinine, Ser 0.70; Potassium 4.8; Sodium 141   Additional studies/ records that were reviewed today include:  none    ASSESSMENT:    1. OSA (obstructive sleep apnea)   2. Essential hypertension       PLAN:  In order of problems listed above:  OSA - The patient is tolerating PAP therapy well without any problems. The PAP download performed by his DME was personally reviewed and interpreted by me today and showed an AHI of 2.2 /hr on auto CPAP from 7-13 cm H2O with 40% compliance in using more than 4 hours nightly.  The patient has been using and benefiting from PAP use and will continue to benefit from therapy.  - She has had problems with keeping her mask on in the past but uses a mouthguard as well so that if she does take her CPAP off at least she has some other therapy for her CPAP - Encouraged her to be compliant with her CPAP therapy - she has lost over 30lbs and would like to see if she still has OSA so I will get a HST off CPAP but ok to use oral device >>If she still has OSA will order a new device since her device is over 68 years old and the filter  backing.   2.  Hypertension - BP controlled - Continue Avapro  150 mg daily and Toprol -XL 25 mg daily with as needed refills -I have personally reviewed and interpreted outside labs performed  by patient's PCP which showed serum creatinine 0.7 and potassium 4.3 on 03/16/2023  Time Spent: 20 minutes total time of encounter, including 15 minutes spent in face-to-face patient care on the date of this encounter. This time includes coordination of care and counseling regarding above mentioned problem list. Remainder of non-face-to-face time involved reviewing chart documents/testing relevant to the patient encounter and documentation in the medical record. I have independently reviewed documentation from referring provider  Medication Adjustments/Labs and Tests Ordered: Current medicines are reviewed at length with the patient today.  Concerns regarding medicines are outlined above.  Medication changes, Labs and Tests ordered today are listed in the Patient Instructions below.  There are no Patient Instructions on file for this visit.   Signed, Wilbert Bihari, MD  07/14/2023 10:44 AM    [facility] [address] [place], [place], [place]  [address] Phone: [phone]; Fax: [phone]

## 2023-08-08 NOTE — Telephone Encounter (Signed)
 Pt has not heard anything back from us  regarding her Watch Pat pin/insurance approval. Requesting cb

## 2023-08-09 NOTE — Telephone Encounter (Signed)
**Note De-Identified Talyah Seder Obfuscation** Ordering provider: Dr Shlomo Associated diagnoses: OSA-G47.33 and Fatigue-R53.83   WatchPAT PA obtained on 08/09/2023 by Nusrat Encarnacion, Avelina HERO, LPN. Authorization: Per the [employer], a PA is not required for CPT Code: 04199 ([place]-HST).  4.   Patient notified of PIN ([id number]) on 08/09/2023 Kiona Blume Notification Method: phone.  Phone note routed to covering staff for follow-up.

## 2023-08-16 ENCOUNTER — Encounter (INDEPENDENT_AMBULATORY_CARE_PROVIDER_SITE_OTHER): Payer: Self-pay | Admitting: Cardiology

## 2023-08-16 DIAGNOSIS — G4733 Obstructive sleep apnea (adult) (pediatric): Secondary | ICD-10-CM

## 2023-08-17 ENCOUNTER — Ambulatory Visit: Attending: Cardiology

## 2023-08-17 DIAGNOSIS — G4733 Obstructive sleep apnea (adult) (pediatric): Secondary | ICD-10-CM

## 2023-08-17 NOTE — Procedures (Signed)
   SLEEP STUDY REPORT Patient Information Study Date: 08/16/2023 Patient Name: Taylor Lee Patient ID: [id number] Birth Date: [birth year] Age: 73 Gender: Female BMI: 29.8 (W=178 lb, H=5' 5'') Stopbang: 4 Referring Physician: Wilbert Bihari, MD  TEST DESCRIPTION: Home sleep apnea testing was completed using the WatchPat, a Type 1 device, utilizing  peripheral arterial tonometry (PAT), chest movement, actigraphy, pulse oximetry, pulse rate, body position and snore.  AHI was calculated with apnea and hypopnea using valid sleep time as the denominator. RDI includes apneas,  hypopneas, and RERAs. The data acquired and the scoring of sleep and all associated events were performed in  accordance with the recommended standards and specifications as outlined in the AASM Manual for the Scoring of  Sleep and Associated Events 2.2.0 (2015).   FINDINGS:   1. Mild Obstructive Sleep Apnea with AHI 9.3/hr overall and moderate during REM sleep with REM AHI 21.5/hr.   2. No Central Sleep Apnea with pAHIc 0.4/hr.   3. Oxygen desaturations as low as 85%.   4. Moderate to severe snoring was present. O2 sats were < 88% for 5.7 min.   5. Total sleep time was 6 hrs and 55 min.   6. 19.5% of total sleep time was spent in REM sleep.   7. Normal sleep onset latency at 18 min.   8. Prolonged REM sleep onset latency at 138 min.   9. Total awakenings were 7.  10. Arrhythmia detection: None  DIAGNOSIS: Mild Obstructive Sleep Apnea (G47.33) Nocturnal Hypoxemia  RECOMMENDATIONS: 1. Clinical correlation of these findings is necessary. The decision to treat obstructive sleep apnea (OSA) is usually  based on the presence of apnea symptoms or the presence of associated medical conditions such as Hypertension,  Congestive Heart Failure, Atrial Fibrillation or Obesity. The most common symptoms of OSA are snoring, gasping for  breath while sleeping, daytime sleepiness and fatigue.  2. Initiating apnea therapy is  recommended given the presence of symptoms and/or associated conditions.  Recommend proceeding with one of the following:  a. Auto-CPAP therapy with a pressure range of 5-20cm H2O.  b. An oral appliance (OA) that can be obtained from certain dentists with expertise in sleep medicine. These are  primarily of use in non-obese patients with mild and moderate disease.  c. An ENT consultation which may be useful to look for specific causes of obstruction and possible treatment  options.  d. If patient is intolerant to PAP therapy, consider referral to ENT for evaluation for hypoglossal nerve stimulator.  3. Close follow-up is necessary to ensure success with CPAP or oral appliance therapy for maximum benefit . 4. A follow-up oximetry study on CPAP is recommended to assess the adequacy of therapy and determine the need  for supplemental oxygen or the potential need for Bi-level therapy. An arterial blood gas to determine the adequacy of  baseline ventilation and oxygenation should also be considered. 5. Healthy sleep recommendations include: adequate nightly sleep (normal 7-9 hrs/night), avoidance of caffeine after  noon and alcohol  near bedtime, and maintaining a sleep environment that is cool, dark and quiet. 6. Weight loss for overweight patients is recommended. Even modest amounts of weight loss can significantly  improve the severity of sleep apnea. 7. Snoring recommendations include: weight loss where appropriate, side sleeping, and avoidance of alcohol  before  bed. 8. Operation of motor vehicle should be avoided when sleepy.  Signature: Wilbert Bihari, MD; [facility]; Diplomat, American Board of Sleep  Medicine Electronically Signed: 08/17/2023 8:36:35 AM

## 2023-08-24 DIAGNOSIS — D1801 Hemangioma of skin and subcutaneous tissue: Secondary | ICD-10-CM | POA: Diagnosis not present

## 2023-08-24 DIAGNOSIS — L821 Other seborrheic keratosis: Secondary | ICD-10-CM | POA: Diagnosis not present

## 2023-08-24 DIAGNOSIS — L820 Inflamed seborrheic keratosis: Secondary | ICD-10-CM | POA: Diagnosis not present

## 2023-09-13 ENCOUNTER — Telehealth: Payer: Self-pay | Admitting: *Deleted

## 2023-09-13 DIAGNOSIS — G4733 Obstructive sleep apnea (adult) (pediatric): Secondary | ICD-10-CM

## 2023-09-13 DIAGNOSIS — I10 Essential (primary) hypertension: Secondary | ICD-10-CM

## 2023-09-13 NOTE — Telephone Encounter (Signed)
 DME=[facility]  Upon patient request DME selection is [facility]. Patient understands he will be contacted by [facility] to set up his cpap. Patient understands to call if [facility] does not contact him with new setup in a timely manner. Patient understands they will be called once confirmation has been received from [facility]/ that they have received their new machine to schedule 10 week follow up appointment.   [facility] notified of new cpap order  Please add to airview Patient was grateful for the call and thanked me.

## 2023-09-13 NOTE — Telephone Encounter (Signed)
-----   Message from Wilbert Bihari sent at 08/17/2023  8:37 AM EDT ----- Please let patient know that they have sleep apnea and recommend treating with CPAP.  Please order an auto CPAP from 4-15cm H2O with heated humidity and mask of choice.  Order overnight pulse ox on CPAP.  Followup with me in 6 weeks.

## 2023-09-14 DIAGNOSIS — E039 Hypothyroidism, unspecified: Secondary | ICD-10-CM | POA: Diagnosis not present

## 2023-10-04 DIAGNOSIS — G4733 Obstructive sleep apnea (adult) (pediatric): Secondary | ICD-10-CM | POA: Diagnosis not present

## 2023-10-09 DIAGNOSIS — G4733 Obstructive sleep apnea (adult) (pediatric): Secondary | ICD-10-CM | POA: Diagnosis not present

## 2023-10-26 DIAGNOSIS — G4730 Sleep apnea, unspecified: Secondary | ICD-10-CM | POA: Diagnosis not present

## 2023-10-26 DIAGNOSIS — R0902 Hypoxemia: Secondary | ICD-10-CM | POA: Diagnosis not present

## 2023-11-07 ENCOUNTER — Telehealth: Payer: Self-pay | Admitting: *Deleted

## 2023-11-10 NOTE — Telephone Encounter (Signed)
 Patient was notified by her mychart that she has no nocturnal hypoxemia on cpap.

## 2023-11-24 DIAGNOSIS — E1169 Type 2 diabetes mellitus with other specified complication: Secondary | ICD-10-CM | POA: Diagnosis not present

## 2023-11-24 DIAGNOSIS — G4700 Insomnia, unspecified: Secondary | ICD-10-CM | POA: Diagnosis not present

## 2023-11-24 DIAGNOSIS — I700 Atherosclerosis of aorta: Secondary | ICD-10-CM | POA: Diagnosis not present

## 2023-11-24 DIAGNOSIS — I10 Essential (primary) hypertension: Secondary | ICD-10-CM | POA: Diagnosis not present

## 2023-11-24 DIAGNOSIS — N393 Stress incontinence (female) (male): Secondary | ICD-10-CM | POA: Diagnosis not present

## 2023-11-24 DIAGNOSIS — K429 Umbilical hernia without obstruction or gangrene: Secondary | ICD-10-CM | POA: Diagnosis not present

## 2023-11-24 DIAGNOSIS — K2970 Gastritis, unspecified, without bleeding: Secondary | ICD-10-CM | POA: Diagnosis not present

## 2023-11-24 DIAGNOSIS — F3289 Other specified depressive episodes: Secondary | ICD-10-CM | POA: Diagnosis not present

## 2023-11-24 DIAGNOSIS — E039 Hypothyroidism, unspecified: Secondary | ICD-10-CM | POA: Diagnosis not present

## 2023-11-24 DIAGNOSIS — M5136 Other intervertebral disc degeneration, lumbar region with discogenic back pain only: Secondary | ICD-10-CM | POA: Diagnosis not present

## 2023-11-24 DIAGNOSIS — E669 Obesity, unspecified: Secondary | ICD-10-CM | POA: Diagnosis not present

## 2023-11-24 DIAGNOSIS — G4733 Obstructive sleep apnea (adult) (pediatric): Secondary | ICD-10-CM | POA: Diagnosis not present

## 2023-11-28 ENCOUNTER — Other Ambulatory Visit: Payer: Self-pay

## 2023-11-28 ENCOUNTER — Encounter (HOSPITAL_BASED_OUTPATIENT_CLINIC_OR_DEPARTMENT_OTHER): Payer: Self-pay | Admitting: Emergency Medicine

## 2023-11-28 ENCOUNTER — Emergency Department (HOSPITAL_BASED_OUTPATIENT_CLINIC_OR_DEPARTMENT_OTHER)
Admission: EM | Admit: 2023-11-28 | Discharge: 2023-11-28 | Disposition: A | Discharge status: Home / Self Care | Attending: Emergency Medicine | Admitting: Emergency Medicine

## 2023-11-28 DIAGNOSIS — E039 Hypothyroidism, unspecified: Secondary | ICD-10-CM | POA: Insufficient documentation

## 2023-11-28 DIAGNOSIS — R002 Palpitations: Secondary | ICD-10-CM | POA: Diagnosis not present

## 2023-11-28 LAB — CBC WITH DIFFERENTIAL/PLATELET
Abs Immature Granulocytes: 0.02 K/uL (ref 0.00–0.07)
Basophils Absolute: 0.1 K/uL (ref 0.0–0.1)
Basophils Relative: 1 %
Eosinophils Absolute: 0.2 K/uL (ref 0.0–0.5)
Eosinophils Relative: 2 %
HCT: 39.3 % (ref 36.0–46.0)
Hemoglobin: 13.4 g/dL (ref 12.0–15.0)
Immature Granulocytes: 0 %
Lymphocytes Relative: 27 %
Lymphs Abs: 2.3 K/uL (ref 0.7–4.0)
MCH: 30.3 pg (ref 26.0–34.0)
MCHC: 34.1 g/dL (ref 30.0–36.0)
MCV: 88.9 fL (ref 80.0–100.0)
Monocytes Absolute: 0.7 K/uL (ref 0.1–1.0)
Monocytes Relative: 9 %
Neutro Abs: 5.2 K/uL (ref 1.7–7.7)
Neutrophils Relative %: 61 %
Platelets: 267 K/uL (ref 150–400)
RBC: 4.42 MIL/uL (ref 3.87–5.11)
RDW: 12.1 % (ref 11.5–15.5)
WBC: 8.6 K/uL (ref 4.0–10.5)
nRBC: 0 % (ref 0.0–0.2)

## 2023-11-28 LAB — BASIC METABOLIC PANEL WITH GFR
Anion gap: 14 (ref 5–15)
BUN: 17 mg/dL (ref 8–23)
CO2: 22 mmol/L (ref 22–32)
Calcium: 9.5 mg/dL (ref 8.9–10.3)
Chloride: 105 mmol/L (ref 98–111)
Creatinine, Ser: 0.81 mg/dL (ref 0.44–1.00)
GFR, Estimated: >60 mL/min (ref >60)
Glucose, Bld: 145 mg/dL — ABNORMAL HIGH (ref 70–99)
Potassium: 3.5 mmol/L (ref 3.5–5.1)
Sodium: 141 mmol/L (ref 135–145)

## 2023-11-28 NOTE — ED Notes (Signed)
 Pt took 1 st dose of mounjaro yesterday. Pt ? if the mounjaro caused this episode of cardiac issues this AM.

## 2023-11-28 NOTE — ED Triage Notes (Signed)
 Pt reports taking first dose of Mounjaro yesterday morning and feeling weird increasing over time yesterday. Pt reports seeing her apple watch say her HR was 300 along with chest tightness. HR on arrival 104-115. Pt hands visibly shaky. Denies chest tightness now, states she is feeling much better.

## 2023-11-28 NOTE — ED Provider Notes (Signed)
 [place] EMERGENCY DEPARTMENT AT [facility] [place] Provider Note   CSN: [id number] Arrival date & time: 11/28/23  [id number]     Patient presents with: Tachycardia   Taylor Lee is a 73 y.o. female.   Patient is a 73 year old female with past medical history of hyperlipidemia, hypothyroidism, herniated disc.  Patient presenting today with complaints of palpitations.  She was recently prescribed Mounjaro by her primary doctor and took her first dose this morning.  She reports feeling sluggish all day, then this evening was awakened from sleep with an episode of her heart racing.  She states that her watch informed her her heart rate was 300.  The symptoms lasted about 20 minutes, then resolved prior to arriving at the ER.  She denies to me that she experienced any chest pain or difficulty breathing.       Prior to Admission medications   Medication Sig Start Date End Date Taking? Authorizing Provider  HYDROcodone -acetaminophen  (NORCO/VICODIN) 5-325 MG tablet Take 1 tablet by mouth 2 (two) times daily as needed for moderate pain.    [provider]  irbesartan  (AVAPRO ) 150 MG tablet Take 1 tablet (150 mg total) by mouth daily. 09/08/22   Kate Lonni CROME, MD  liothyronine (CYTOMEL) 5 MCG tablet Take 5-10 mcg by mouth in the morning and at bedtime. 10 mg in am and 1 tablet in evening 01/26/19   [provider]  metoprolol  succinate (TOPROL  XL) 25 MG 24 hr tablet Take 1 tablet (25 mg total) by mouth daily. 03/30/23   Kate Lonni CROME, MD  pantoprazole (PROTONIX) 40 MG tablet 40 mg as needed. Patient not taking: Reported on 07/14/2023 12/19/18   [provider]  SYNTHROID  75 MCG tablet TAKE 1 TABLET BY MOUTH DAILY 04/08/12   Panosh, Wanda K, MD    Allergies: Prednisone and Levothyroxine     Review of Systems  All other systems reviewed and are negative.   Updated Vital Signs BP (!) 126/91 (BP Location: Right Arm)   Pulse (!) 104   Temp 98.2  F (36.8 C) (Oral)   Resp (!) 22   Ht 5' 5 (1.651 m)   Wt 84.8 kg   SpO2 97%   BMI 31.12 kg/m   Physical Exam Vitals and nursing note reviewed.  Constitutional:      General: She is not in acute distress.    Appearance: She is well-developed. She is not diaphoretic.  HENT:     Head: Normocephalic and atraumatic.  Cardiovascular:     Rate and Rhythm: Normal rate and regular rhythm.     Heart sounds: No murmur heard.    No friction rub. No gallop.  Pulmonary:     Effort: Pulmonary effort is normal. No respiratory distress.     Breath sounds: Normal breath sounds. No wheezing.  Abdominal:     General: Bowel sounds are normal. There is no distension.     Palpations: Abdomen is soft.     Tenderness: There is no abdominal tenderness.  Musculoskeletal:        General: Normal range of motion.     Cervical back: Normal range of motion and neck supple.  Skin:    General: Skin is warm and dry.  Neurological:     General: No focal deficit present.     Mental Status: She is alert and oriented to person, place, and time.     (all labs ordered are listed, but only abnormal results are displayed) Labs Reviewed  BASIC  METABOLIC PANEL WITH GFR  CBC WITH DIFFERENTIAL/PLATELET    EKG: EKG Interpretation Date/Time:  Monday November 28 2023 01:43:27 EST Ventricular Rate:  98 PR Interval:  173 QRS Duration:  83 QT Interval:  348 QTC Calculation: 445 R Axis:   -31  Text Interpretation: Sinus rhythm Left axis deviation Low voltage, precordial leads Confirmed by Geroldine Berg ([id number]) on 11/28/2023 1:51:09 AM  Radiology: No results found.   Procedures   Medications Ordered in the ED - No data to display                                  Medical Decision Making Amount and/or Complexity of Data Reviewed Labs: ordered.   Patient is a 73 year old female presenting with complaints of palpitations as described in the HPI.  Symptoms began while she was sleeping, lasted  approximately 20 minutes, then resolved prior to arriving here.  Patient arrives here with stable vital signs and is afebrile.  Her EKG reveals a sinus rhythm with rate in the 90s.  Physical examination is unremarkable.  Laboratory studies obtained including CBC and basic metabolic panel.  She has a mild elevation of the blood sugar, but labs otherwise unremarkable.  Patient has been observed in the ER and has had no further ectopy.  She now feels fine.  She is concerned that this episode may have been related to starting Mounjaro because she took her first dose this morning.  I will have patient speak with her primary doctor as to whether or not to continue this medicine.  In the meantime, patient is to return as needed if symptoms worsen or change.  The cause of her palpitations unclear.     Final diagnoses:  None    ED Discharge Orders     None          Geroldine Berg, MD 11/28/23 [phone]

## 2023-11-28 NOTE — Discharge Instructions (Signed)
 Follow-up with your primary doctor in the next few days to discuss whether or not to continue Mounjaro.  Return to the ER if you develop recurrent symptoms, chest pain, difficulty breathing, or for other new and concerning symptoms.

## 2023-11-29 DIAGNOSIS — Z83511 Family history of glaucoma: Secondary | ICD-10-CM | POA: Diagnosis not present

## 2023-11-29 DIAGNOSIS — H2513 Age-related nuclear cataract, bilateral: Secondary | ICD-10-CM | POA: Diagnosis not present

## 2023-11-29 DIAGNOSIS — H25042 Posterior subcapsular polar age-related cataract, left eye: Secondary | ICD-10-CM | POA: Diagnosis not present

## 2023-11-29 DIAGNOSIS — H02834 Dermatochalasis of left upper eyelid: Secondary | ICD-10-CM | POA: Diagnosis not present

## 2023-11-29 DIAGNOSIS — R7303 Prediabetes: Secondary | ICD-10-CM | POA: Diagnosis not present

## 2023-11-29 DIAGNOSIS — H40023 Open angle with borderline findings, high risk, bilateral: Secondary | ICD-10-CM | POA: Diagnosis not present

## 2023-11-29 DIAGNOSIS — H524 Presbyopia: Secondary | ICD-10-CM | POA: Diagnosis not present

## 2023-11-29 DIAGNOSIS — H43393 Other vitreous opacities, bilateral: Secondary | ICD-10-CM | POA: Diagnosis not present

## 2023-11-29 DIAGNOSIS — H52223 Regular astigmatism, bilateral: Secondary | ICD-10-CM | POA: Diagnosis not present

## 2023-11-29 DIAGNOSIS — H5203 Hypermetropia, bilateral: Secondary | ICD-10-CM | POA: Diagnosis not present

## 2023-11-29 DIAGNOSIS — H02831 Dermatochalasis of right upper eyelid: Secondary | ICD-10-CM | POA: Diagnosis not present

## 2023-11-30 DIAGNOSIS — E1169 Type 2 diabetes mellitus with other specified complication: Secondary | ICD-10-CM | POA: Diagnosis not present

## 2023-11-30 DIAGNOSIS — I10 Essential (primary) hypertension: Secondary | ICD-10-CM | POA: Diagnosis not present

## 2023-11-30 DIAGNOSIS — I471 Supraventricular tachycardia, unspecified: Secondary | ICD-10-CM | POA: Diagnosis not present

## 2023-11-30 DIAGNOSIS — Z6831 Body mass index (BMI) 31.0-31.9, adult: Secondary | ICD-10-CM | POA: Diagnosis not present

## 2023-11-30 DIAGNOSIS — G4733 Obstructive sleep apnea (adult) (pediatric): Secondary | ICD-10-CM | POA: Diagnosis not present

## 2023-11-30 DIAGNOSIS — E669 Obesity, unspecified: Secondary | ICD-10-CM | POA: Diagnosis not present

## 2024-01-03 DIAGNOSIS — G4733 Obstructive sleep apnea (adult) (pediatric): Secondary | ICD-10-CM | POA: Diagnosis not present

## 2024-01-05 DIAGNOSIS — G4733 Obstructive sleep apnea (adult) (pediatric): Secondary | ICD-10-CM | POA: Diagnosis not present

## 2024-01-07 DIAGNOSIS — G4733 Obstructive sleep apnea (adult) (pediatric): Secondary | ICD-10-CM | POA: Diagnosis not present

## 2024-01-09 DIAGNOSIS — G4733 Obstructive sleep apnea (adult) (pediatric): Secondary | ICD-10-CM | POA: Diagnosis not present
